# Patient Record
Sex: Female | Born: 2000 | Race: Black or African American | Hispanic: No | Marital: Single | Smoking: Never smoker
Health system: Southern US, Community
[De-identification: ages and names within clinical notes are randomized; demographics above are authoritative.]

## PROBLEM LIST (undated history)

## (undated) DIAGNOSIS — F29 Unspecified psychosis not due to a substance or known physiological condition: Secondary | ICD-10-CM

## (undated) DIAGNOSIS — J45909 Unspecified asthma, uncomplicated: Secondary | ICD-10-CM

## (undated) DIAGNOSIS — Z789 Other specified health status: Secondary | ICD-10-CM

---
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
## 2018-03-29 ENCOUNTER — Other Ambulatory Visit: Payer: Self-pay

## 2018-03-29 ENCOUNTER — Encounter (HOSPITAL_COMMUNITY): Payer: Self-pay | Admitting: *Deleted

## 2018-03-29 ENCOUNTER — Inpatient Hospital Stay (HOSPITAL_COMMUNITY)
Admission: RE | Admit: 2018-03-29 | Discharge: 2018-04-05 | DRG: 885 | Disposition: A | Discharge status: Home / Self Care | Payer: Medicaid Other | Attending: Psychiatry | Admitting: Psychiatry

## 2018-03-29 DIAGNOSIS — F4310 Post-traumatic stress disorder, unspecified: Secondary | ICD-10-CM | POA: Diagnosis present

## 2018-03-29 DIAGNOSIS — J45909 Unspecified asthma, uncomplicated: Secondary | ICD-10-CM | POA: Diagnosis present

## 2018-03-29 DIAGNOSIS — F209 Schizophrenia, unspecified: Secondary | ICD-10-CM | POA: Diagnosis present

## 2018-03-29 DIAGNOSIS — F70 Mild intellectual disabilities: Secondary | ICD-10-CM | POA: Diagnosis present

## 2018-03-29 DIAGNOSIS — R450 Nervousness: Secondary | ICD-10-CM | POA: Diagnosis not present

## 2018-03-29 DIAGNOSIS — F4010 Social phobia, unspecified: Secondary | ICD-10-CM | POA: Diagnosis present

## 2018-03-29 DIAGNOSIS — G4700 Insomnia, unspecified: Secondary | ICD-10-CM | POA: Diagnosis present

## 2018-03-29 DIAGNOSIS — Z79899 Other long term (current) drug therapy: Secondary | ICD-10-CM

## 2018-03-29 DIAGNOSIS — F250 Schizoaffective disorder, bipolar type: Principal | ICD-10-CM | POA: Diagnosis present

## 2018-03-29 DIAGNOSIS — R4689 Other symptoms and signs involving appearance and behavior: Secondary | ICD-10-CM

## 2018-03-29 DIAGNOSIS — F419 Anxiety disorder, unspecified: Secondary | ICD-10-CM | POA: Diagnosis present

## 2018-03-29 MED ORDER — DIPHENHYDRAMINE HCL 50 MG/ML IJ SOLN
50.0000 mg | Freq: Once | INTRAMUSCULAR | Status: AC
Start: 2018-03-29 — End: 2018-03-29
  Filled 2018-03-29: qty 1

## 2018-03-29 MED ORDER — OLANZAPINE 10 MG PO TBDP
10.0000 mg | ORAL_TABLET | Freq: Every day | ORAL | Status: DC
  Administered 2018-03-29 – 2018-03-30 (×2): 10 mg via ORAL
  Filled 2018-03-29 (×5): qty 1

## 2018-03-29 MED ORDER — DIPHENHYDRAMINE HCL 50 MG PO CAPS
50.0000 mg | ORAL_CAPSULE | Freq: Once | ORAL | Status: AC
  Administered 2018-03-29: 50 mg via ORAL
  Filled 2018-03-29: qty 1
  Filled 2018-03-29: qty 2

## 2018-03-29 MED ORDER — LORAZEPAM 1 MG PO TABS
1.0000 mg | ORAL_TABLET | Freq: Every evening | ORAL | Status: DC | PRN
  Administered 2018-03-31 – 2018-04-02 (×3): 1 mg via ORAL
  Filled 2018-03-29 (×5): qty 1

## 2018-03-29 MED ORDER — LORAZEPAM 1 MG PO TABS
1.0000 mg | ORAL_TABLET | Freq: Once | ORAL | Status: AC
  Administered 2018-03-29: 1 mg via ORAL
  Filled 2018-03-29: qty 1

## 2018-03-29 MED ORDER — LORAZEPAM 2 MG/ML IJ SOLN: 1.0000 mg | Freq: Once | INTRAMUSCULAR | Status: DC | PRN

## 2018-03-29 MED ORDER — DIPHENHYDRAMINE HCL 12.5 MG/5ML PO ELIX
50.0000 mg | ORAL_SOLUTION | Freq: Every day | ORAL | Status: DC
  Filled 2018-03-29: qty 20

## 2018-03-29 MED ORDER — LORAZEPAM 2 MG/ML IJ SOLN
1.0000 mg | Freq: Once | INTRAMUSCULAR | Status: AC | PRN
  Administered 2018-03-30: 1 mg via INTRAMUSCULAR
  Filled 2018-03-29: qty 1

## 2018-03-29 MED ORDER — ALBUTEROL SULFATE HFA 108 (90 BASE) MCG/ACT IN AERS: 2.0000 | INHALATION_SPRAY | RESPIRATORY_TRACT | Status: DC | PRN

## 2018-03-29 MED ORDER — DIPHENHYDRAMINE HCL 50 MG/ML IJ SOLN: 50.0000 mg | Freq: Once | INTRAMUSCULAR | Status: DC | PRN

## 2018-03-29 MED ORDER — DIPHENHYDRAMINE HCL 50 MG PO CAPS
50.0000 mg | ORAL_CAPSULE | Freq: Every day | ORAL | Status: DC
  Administered 2018-03-29 – 2018-04-04 (×7): 50 mg via ORAL
  Filled 2018-03-29: qty 1
  Filled 2018-03-29: qty 2
  Filled 2018-03-29: qty 1
  Filled 2018-03-29: qty 2
  Filled 2018-03-29 (×6): qty 1
  Filled 2018-03-29: qty 2
  Filled 2018-03-29: qty 1
  Filled 2018-03-29: qty 2

## 2018-03-29 MED ORDER — LORAZEPAM 2 MG/ML IJ SOLN: 1.0000 mg | Freq: Once | INTRAMUSCULAR | Status: AC

## 2018-03-29 MED ORDER — HALOPERIDOL LACTATE 5 MG/ML IJ SOLN: 1.0000 mg | Freq: Once | INTRAMUSCULAR | Status: DC | PRN

## 2018-03-29 MED ORDER — DIPHENHYDRAMINE HCL 50 MG/ML IJ SOLN
50.0000 mg | Freq: Once | INTRAMUSCULAR | Status: AC | PRN
  Administered 2018-03-30: 50 mg via INTRAMUSCULAR
  Filled 2018-03-29: qty 1

## 2018-03-29 MED ORDER — TRAZODONE HCL 50 MG PO TABS
50.0000 mg | ORAL_TABLET | Freq: Every evening | ORAL | Status: DC | PRN
  Administered 2018-03-29 – 2018-04-04 (×10): 50 mg via ORAL
  Filled 2018-03-29 (×7): qty 1

## 2018-03-29 MED ORDER — HALOPERIDOL LACTATE 5 MG/ML IJ SOLN
1.0000 mg | Freq: Once | INTRAMUSCULAR | Status: AC | PRN
  Administered 2018-03-30: 1 mg via INTRAMUSCULAR
  Filled 2018-03-29: qty 1

## 2018-03-29 MED ORDER — HALOPERIDOL 1 MG PO TABS
1.0000 mg | ORAL_TABLET | Freq: Once | ORAL | Status: AC
  Administered 2018-03-29: 1 mg via ORAL
  Filled 2018-03-29: qty 2
  Filled 2018-03-29: qty 1

## 2018-03-29 NOTE — Tx Team (Signed)
Initial Treatment Plan 03/29/2018 4:10 PM Burnett ShengMarian A Heatherington ZOX:[id number]    PATIENT STRESSORS: Other: hallucinations   PATIENT STRENGTHS: Ability for insight General fund of knowledge Supportive family/friends   PATIENT IDENTIFIED PROBLEMS: Hallucinations "My attitude and coping skills"                     DISCHARGE CRITERIA:  Ability to meet basic life and health needs Improved stabilization in mood, thinking, and/or behavior Verbal commitment to aftercare and medication compliance  PRELIMINARY DISCHARGE PLAN: Attend aftercare/continuing care group Return to previous living arrangement  PATIENT/FAMILY INVOLVEMENT: This treatment plan has been presented to and reviewed with the patient, Burnett ShengMarian A Derringer, and/or family member, .  The patient and family have been given the opportunity to ask questions and make suggestions.  Kyston Gonce, [place], [place] 03/29/2018, 4:10 PM

## 2018-03-29 NOTE — BH Assessment (Signed)
Assessment Note  Tracy Bond is an 17 y.o. female who presents voluntarily accompanied by her mom and step dad reporting primary symptoms of anxiety and hearing voices and seeing things. Pt has a history of psychosis, and her mom says that she is triggered at times by "big events", and she recently had a birthday.  No other stressors are known and pt is taking her medication prescribed by psychiatrist at a neurology center and was recently evaluated by a psychologist Dr. Langston Masker at [facility] (see chart for psychological eval, IQ of 64).  Pt acknowledges symptoms including crying spells, social withdrawal, loss of interest in usual pleasures, decreased concentration, fatigue, irritability, decreased sleep.   Pt denies SI, HI, SA. PT describes 0 past attempts or history of violence. Pt states that onset of symptoms began a few days ago.  Pt identifies primary residence as with parents. Pt identifies primary supports as parents, family. Pt's social history includes, "Being a homebody". Pt states she has had no previous work history. Pt identifies no previous legal involvement. Pt denies abuse history.  Pt describes family MH as: paternal GM has mental health history.  Pt has poor insight and judgment. Pt's memory is impaired by mental status.? ? MSE: Pt is casually dressed, alert, oriented x4 with blocked, pressured speech and restless motor behavior with mannerisms. Eye contact is poor. Pt's mood is fearful and affect is anxious. Affect is congruent with mood. Thought process is coherent and irrelevant. There is indication that pt is currently responding to internal stimuli and experiencing delusional thought content. Pt was cooperative throughout assessment.   Shuvon, NP, recommended intpatient psychiatric treatment. Per Inetta Fermo, [facility] has a bed available.   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 Asthma   . Medical history non-contributory   . Psychosis 03/19/2017    No past surgical history on file.  Family History:  Family History  Problem Relation Age of Onset  . Mental illness Neg Hx     Social History:  reports that she has never smoked. She has never used smokeless tobacco. She reports that she does not drink alcohol or use drugs.  Additional Social History:  Alcohol / Drug Use Pain Medications: none Prescriptions: none Over the Counter: none Longest period of sobriety (when/how long): NA  CIWA:   COWS:    Allergies: No Known Allergies  Home Medications:  (Not in a hospital admission)  OB/GYN Status:  No LMP recorded.  General Assessment Data Location of Assessment: [facility] Assessment Services TTS Assessment: In system Is this a Tele or Face-to-Face Assessment?: Face-to-Face Is this an Initial Assessment or a Re-assessment for this encounter?: Initial Assessment Marital status: Single Living Arrangements: Parent Can pt return to current living arrangement?: Yes Admission Status: Voluntary Is patient capable of signing voluntary admission?: Yes Referral Source: Self/Family/Friend Insurance type: MCD  Medical Screening Exam [facility] Walk-in ONLY) Medical Exam completed: Yes  Crisis Care Plan Living Arrangements: Parent Name of Psychiatrist: Dr. Isidor Holts Name of Therapist: none  Education Status Is patient currently in school?: No Is the patient employed, unemployed or receiving disability?: Unemployed  Risk to self with the past 6 months Suicidal Ideation: No Has patient been a risk to self within the past 6 months prior to admission? : No Suicidal Intent: No Has patient had any suicidal intent within the past 6 months prior to admission? : No Is patient at risk  for suicide?: No Suicidal Plan?: No Has patient had any suicidal plan within the past 6 months prior to admission? : No Access to Means: No What has been your use of drugs/alcohol  within the last 12 months?: (denies) Previous Attempts/Gestures: No Intentional Self Injurious Behavior: None Family Suicide History: No Recent stressful life event(s): (birthday) Persecutory voices/beliefs?: Yes Depression: Yes Depression Symptoms: Tearfulness, Insomnia Substance abuse history and/or treatment for substance abuse?: No Suicide prevention information given to non-admitted patients: Yes  Risk to Others within the past 6 months Homicidal Ideation: No Does patient have any lifetime risk of violence toward others beyond the six months prior to admission? : No Thoughts of Harm to Others: No Current Homicidal Intent: No Current Homicidal Plan: No Access to Homicidal Means: No History of harm to others?: No Assessment of Violence: None Noted Does patient have access to weapons?: No Criminal Charges Pending?: No Does patient have a court date: No Is patient on probation?: No  Psychosis Hallucinations: Auditory, Visual Delusions: None noted  Mental Status Report Appearance/Hygiene: Unremarkable Eye Contact: Fair Motor Activity: Agitation, Mannerisms, Restlessness Speech: Tangential, Rapid, Pressured Level of Consciousness: Alert Mood: Anxious, Helpless Affect: Anxious, Blunted, Fearful, Frightened, Labile Anxiety Level: Severe Thought Processes: Thought Blocking, Irrelevant Judgement: Impaired Orientation: Person, Situation Obsessive Compulsive Thoughts/Behaviors: Severe  Cognitive Functioning Concentration: Poor Memory: Recent Impaired, Remote Intact Is patient IDD: Yes Level of Function: 4-5th grade Is patient DD?: No I IQ score available?: No Insight: Poor Impulse Control: Poor Appetite: Good Have you had any weight changes? : No Change Sleep: Decreased Total Hours of Sleep: (hasn't slept in 2 days) Vegetative Symptoms: None  ADLScreening [facility] Assessment Services) Patient's cognitive ability adequate to safely complete daily activities?:  Yes Patient able to express need for assistance with ADLs?: Yes Independently performs ADLs?: Yes (appropriate for developmental age)  Prior Inpatient Therapy Prior Inpatient Therapy: Yes Prior Therapy Dates: 2018 [facility] Reason for Treatment: psychosis  Prior Outpatient Therapy Prior Outpatient Therapy: Yes Prior Therapy Dates: ongoing Prior Therapy Facilty/Provider(s): Dr. Isidor HoltsMorriss  Reason for Treatment: psychosis Does patient have an ACCT team?: No Does patient have Intensive In-House Services?  : No Does patient have [facility] services? : No Does patient have P4CC services?: No  ADL Screening (condition at time of admission) Patient's cognitive ability adequate to safely complete daily activities?: Yes Is the patient deaf or have difficulty hearing?: No Does the patient have difficulty seeing, even when wearing glasses/contacts?: No Does the patient have difficulty concentrating, remembering, or making decisions?: Yes Patient able to express need for assistance with ADLs?: Yes Does the patient have difficulty dressing or bathing?: No Independently performs ADLs?: Yes (appropriate for developmental age) Does the patient have difficulty walking or climbing stairs?: No Weakness of Legs: None Weakness of Arms/Hands: None  Home Assistive Devices/Equipment Home Assistive Devices/Equipment: None  Therapy Consults (therapy consults require a physician order) PT Evaluation Needed: No OT Evalulation Needed: No SLP Evaluation Needed: No Abuse/Neglect Assessment (Assessment to be complete while patient is alone) Abuse/Neglect Assessment Can Be Completed: Yes Physical Abuse: Denies Verbal Abuse: Denies Sexual Abuse: Denies Exploitation of patient/patient's resources: Denies Self-Neglect: Denies Values / Beliefs Cultural Requests During Hospitalization: None Spiritual Requests During Hospitalization: None Consults Spiritual Care Consult Needed:  No Social Work Consult Needed: No Merchant navy officerAdvance Directives (For Healthcare) Does Patient Have a Medical Advance Directive?: No Would patient like information on creating a medical advance directive?: No - Patient declined    Additional Information 1:1 In Past  12 Months?: No CIRT Risk: No Elopement Risk: Yes Does patient have medical clearance?: No     Disposition:  Disposition Initial Assessment Completed for this Encounter: Yes Disposition of Patient: Admit Type of inpatient treatment program: Adult  On Site Evaluation by:   Reviewed with Physician:    Theo Dills 03/29/2018 2:32 PM

## 2018-03-29 NOTE — Progress Notes (Signed)
1:1 Note    D: Patient is observed resting in bed with eyes open. Pt appears animated/anxious/impulsive in affect and mood. Pt denies SI/HI/Pain at this time. Pt endorses AVH. Pt states she sees "fire and pens" coming at her and hears things in which she did not elaborate. Pt appears child-like and noted to be making loud abnormal sounds (sounds like a dog's bark) in her room. When staff approached Pt, she is noted to be hypervigilant. Pt is labile in behavior. Encouragement provided. Will continue with POC.   A: 1:1 continued for patient safety. Sitter within arms reach.  R: Safety maintained on unit.

## 2018-03-29 NOTE — H&P (Signed)
Behavioral Health Medical Screening Exam  Tracy Bond is an 17 y.o. female [facility]; brought I by her mother with complaints of paranoia and psychosis.  Patient tearful anxious; not a good historian.  Parent don't feel patient is safe.   Total Time spent with patient: 30 minutes  Psychiatric Specialty Exam: Physical Exam  Constitutional: She is oriented to person, place, and time. She appears well-developed and well-nourished.  Neck: Normal range of motion. Neck supple.  Respiratory: Effort normal.  Musculoskeletal: Normal range of motion.  Neurological: She is alert and oriented to person, place, and time.  Skin: Skin is warm and dry.    Review of Systems  Psychiatric/Behavioral: Positive for depression and hallucinations. The patient is nervous/anxious.   All other systems reviewed and are negative.   Blood pressure (!) 157/87, pulse (!) 106, temperature 99 F (37.2 C), resp. rate 18, SpO2 100 %.There is no height or weight on file to calculate BMI.  General Appearance: Casual  Eye Contact:  Minimal  Speech:  Clear and Coherent  Volume:  Decreased  Mood:  Anxious  Affect:  Depressed and Tearful  Thought Process:  Disorganized  Orientation:  Full (Time, Place, and Person)  Thought Content:  Hallucinations: Auditory  Suicidal Thoughts:  No  Homicidal Thoughts:  No  Memory:  Immediate;   Poor Recent;   Poor Remote;   Poor  Judgement:  Impaired  Insight:  Lacking  Psychomotor Activity:  Restlessness  Concentration: Concentration: Poor and Attention Span: Poor  Recall:  Poor  Fund of Knowledge:Fair  Language: Good  Akathisia:  No  Handed:  Right  AIMS (if indicated):     Assets:  Desire for Improvement Housing Physical Health Social Support  Sleep:       Musculoskeletal: Strength & Muscle Tone: within normal limits Gait & Station: normal Patient leans: N/A  Blood pressure (!) 157/87, pulse (!) 106, temperature 99 F (37.2 C), resp.  rate 18, SpO2 100 %.  Recommendations:  Inpatient psychiatric treatment   Based on my evaluation the patient does not appear to have an emergency medical condition.  Shuvon Rankin, NP 03/29/2018, 2:38 PM

## 2018-03-29 NOTE — Progress Notes (Signed)
Mother called. Mother was on consent sheet. Code was given. Update provided.

## 2018-03-29 NOTE — Progress Notes (Signed)
Psychoeducational Group Note  Date:  03/29/2018 Time:  2340  Group Topic/Focus:  Wrap-Up Group:   The focus of this group is to help patients review their daily goal of treatment and discuss progress on daily workbooks.  Participation Level: Did Not Attend  Participation Quality:  Not Applicable  Affect:  Not Applicable  Cognitive:  Not Applicable  Insight:  Not Applicable  Engagement in Group: Not Applicable  Additional Comments:  The patient did not attend group this evening since she remained in her bedroom.   Camilia Caywood S 03/29/2018, 11:40 PM

## 2018-03-29 NOTE — Progress Notes (Addendum)
1:1 Note   D: Pt observed sleeping in bed with eyes closed. RR even and unlabored. No distress noted. A: 1:1 observation continues for safety. Sitter within arms reach.  R: Pt remains safe.  

## 2018-03-29 NOTE — Progress Notes (Signed)
1:1 Note: Patient placed on constant supervision for safety.  Patient agitated and reported seeing things like bugs coming at her.  Started barking like a dog and making weird noises.  Observed pacing the hallway, very fearful and tearful.  Running towards staff and shouting" in the name of Jesus."  Saying "Jesus protect me." Routine safety checks maintained every 15 minutes.  Support and encouragement offered as needed.  Patient safe on the unit with supervision.

## 2018-03-29 NOTE — Progress Notes (Signed)
Tracy Bond is a 17 year old female pt admitted on voluntary basis after presenting as a walk-in. On admission, Tracy Bond presents as anxious and fidgety and spoke about how she has been experiencing A/V hallucinations. She denies SI and is able to contract for safety on the unit. Tracy Bond reports that she has been taking her medications as prescribed and reports she feels they are working for her. She denies any substance abuse issues. She reports that she lives at home with mom and her fiancee and will go back there upon discharge. Tracy Bond was oriented to the unit and safety maintained.

## 2018-03-30 DIAGNOSIS — R4689 Other symptoms and signs involving appearance and behavior: Secondary | ICD-10-CM

## 2018-03-30 MED ORDER — LORAZEPAM 2 MG/ML IJ SOLN: 1.0000 mg | Freq: Four times a day (QID) | INTRAMUSCULAR | Status: DC | PRN

## 2018-03-30 MED ORDER — OLANZAPINE 5 MG PO TBDP
5.0000 mg | ORAL_TABLET | Freq: Every day | ORAL | Status: DC | PRN
  Administered 2018-03-30 – 2018-04-04 (×4): 5 mg via ORAL
  Filled 2018-03-30 (×3): qty 1

## 2018-03-30 MED ORDER — LORAZEPAM 1 MG PO TABS
1.0000 mg | ORAL_TABLET | Freq: Four times a day (QID) | ORAL | Status: DC | PRN
  Administered 2018-03-30 – 2018-04-04 (×6): 1 mg via ORAL
  Filled 2018-03-30 (×5): qty 1

## 2018-03-30 NOTE — Progress Notes (Signed)
1:1 Note   D: Pt observed sleeping in bed with eyes closed. RR even and unlabored. No distress noted. A: 1:1 observation continues for safety. Sitter within arms reach.  R: Pt remains safe.  
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
## 2018-03-30 NOTE — Progress Notes (Signed)
Patient being monitored 1:1 Patient required constant redirection by 1:1 staff.  Patient was often noted crying, yelling and in severe dyscontrol. Patient was free of harm.  1:1 staff in close proximity to patient.

## 2018-03-30 NOTE — [facility] Counselor (Signed)
CSW attempted to meet with pt to complete PSA.  Pt was asleep and tech reports pt just given injection.  Will attempt later. Garner NashGregory Issaih Kaus, MSW, LCSW Clinical Social Worker 03/30/2018 3:24 PM

## 2018-03-30 NOTE — Progress Notes (Signed)
Patient being monitored 1:1 Patient required constant redirection by 1:1 staff.  Patient was often noted crying, yelling and in severe dyscontrol. Patient was free of harm.  1:1 staff in close proximity to patient.   

## 2018-03-30 NOTE — Progress Notes (Signed)
1:1 Note   D: Patient observed resting in bed with eyes closed. Pt's mother came to visit this evening; positive interaction noted. Pt appears labile in affect and mood. Pt denies SI/HI/Pain at this time. Pt continues to endorses AVH. Pt appears child-like with attention-seeking behaviors. Poor insight. Pt is paranoid with medications. Pt states Zyprexa makes her gain weight and she does not want to be on it long-term. Pt was encourage to speak with provider. Encouragement and support provided. PRN ativan and trazodone with repeat requested and given. Will continue with POC.   A: 1:1 continued for patient safety. Sitter within arms reach.  R: Safety maintained on unit.

## 2018-03-30 NOTE — Progress Notes (Signed)
Recreation Therapy Notes  INPATIENT RECREATION THERAPY ASSESSMENT  Patient Details Name: Tracy Bond MRN: [id number] DOB: 2001/03/16 Today's Date: 03/30/2018       Information Obtained From: Chart Review  Able to Participate in Assessment/Interview: No  Patient Presentation: Anxious  Reason for Admission (Per Patient): Other (Comments)(Per chart auditory/visual hallucinations, anxious)  Patient Stressors: (Pt unable to answer)  Coping Skills:   (Pt unable to answer)  Leisure Interests (2+):  (Pt unable to answer)  Frequency of Recreation/Participation: (Pt unable to answer)  Awareness of Community Resources:  (Pt unable to answer)  Community Resources:     Current Use:    If no, Barriers?:    Expressed Interest in [address] [employer] CorporationCommunity Resource Information: (Pt unable to answer)  [place]  Patient Main Form of Transportation:    Patient Strengths:  (Unable to answer)  Patient Identified Areas of Improvement:  (Unable to answer)  Patient Goal for Hospitalization:  Per chart, attitude and coping skills  Current SI (including self-harm):     Current HI:     Current AVH:    Staff Intervention Plan: Group Attendance, Collaborate with Interdisciplinary Treatment Team  Consent to Intern Participation: N/A   Caroll RancherMarjette Alesi Zachery, LRT/CTRS  Caroll RancherLindsay, Ash Mcelwain A 03/30/2018, 12:23 PM

## 2018-03-30 NOTE — Progress Notes (Signed)
Recreation Therapy Notes  Date: 7.2.19 Time: 0945 Location: [address] Dayroom  Group Topic: Wellness  Goal Area(s) Addresses:  Patient will define components of whole wellness. Patient will verbalize benefit of whole wellness.  Intervention: Music, Exercise  Activity:  Exercise.  LRT lead group in Bond series of stretches to get them loose.  Once patients were loose, each patient got the opportunity to lead the group in an exercise.  The group went through 4 rounds of exercises with Bond water break in between.    Education: Wellness, Building control surveyorDischarge Planning.   Education Outcome: Acknowledges education/In group clarification offered/Needs additional education.   Clinical Observations/Feedback: Pt did not attend group.    Tracy RancherMarjette Hamza Bond, LRT/CTRS         Tracy AbedLindsay, Tracy Bond 03/30/2018 11:29 AM
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
## 2018-03-30 NOTE — [facility] Group Notes (Signed)
[facility]  Type of Therapy: [facility] Presentation  Participation Level: Did not attend  Participation Quality:  Affect:   Cognitive:   Insight:   Engagement in Therapy:  Modes of Intervention: Discussion, Education and Socialization  Summary of Progress/Problems: [facility] [employer] ([facility]) Speaker came to talk about his personal journey with mental health. The pt processed ways by which to relate to the speaker. MHA speaker provided handouts and educational information pertaining to groups and services [facility]. Pt was engaged in speaker's presentation and was receptive to resources provided.    Lorri FrederickWierda, Ramzi Brathwaite Jon, LCSW 03/30/2018 1:23 PM

## 2018-03-31 DIAGNOSIS — R4689 Other symptoms and signs involving appearance and behavior: Secondary | ICD-10-CM

## 2018-03-31 LAB — CBC
HCT: 38.8 % (ref 36.0–46.0)
Hemoglobin: 12.7 g/dL (ref 12.0–15.0)
MCH: 29.7 pg (ref 26.0–34.0)
MCHC: 32.7 g/dL (ref 30.0–36.0)
MCV: 90.9 fL (ref 78.0–100.0)
PLATELETS: 251 10*3/uL (ref 150–400)
RBC: 4.27 MIL/uL (ref 3.87–5.11)
RDW: 13.7 % (ref 11.5–15.5)
WBC: 5.7 10*3/uL (ref 4.0–10.5)

## 2018-03-31 LAB — COMPREHENSIVE METABOLIC PANEL
ALK PHOS: 58 U/L (ref 38–126)
ALT: 12 U/L (ref 0–44)
ANION GAP: 8 (ref 5–15)
AST: 31 U/L (ref 15–41)
Albumin: 3.7 g/dL (ref 3.5–5.0)
BUN: 8 mg/dL (ref 6–20)
CALCIUM: 8.8 mg/dL — AB (ref 8.9–10.3)
CO2: 27 mmol/L (ref 22–32)
CREATININE: 0.75 mg/dL (ref 0.44–1.00)
Chloride: 107 mmol/L (ref 98–111)
Glucose, Bld: 88 mg/dL (ref 70–99)
Potassium: 3.5 mmol/L (ref 3.5–5.1)
SODIUM: 142 mmol/L (ref 135–145)
Total Bilirubin: 0.6 mg/dL (ref 0.3–1.2)
Total Protein: 7 g/dL (ref 6.5–8.1)

## 2018-03-31 LAB — LIPID PANEL
CHOL/HDL RATIO: 2.8 ratio
CHOLESTEROL: 169 mg/dL (ref 0–200)
HDL: 61 mg/dL (ref >40)
LDL Cholesterol: 100 mg/dL — ABNORMAL HIGH (ref 0–99)
Triglycerides: 40 mg/dL (ref <150)
VLDL: 8 mg/dL (ref 0–40)

## 2018-03-31 LAB — HEMOGLOBIN A1C
HEMOGLOBIN A1C: 5.3 % (ref 4.8–5.6)
MEAN PLASMA GLUCOSE: 105.41 mg/dL

## 2018-03-31 MED ORDER — ARIPIPRAZOLE 10 MG PO TABS
10.0000 mg | ORAL_TABLET | Freq: Every day | ORAL | Status: AC
  Administered 2018-04-01: 10 mg via ORAL
  Filled 2018-03-31: qty 1

## 2018-03-31 MED ORDER — CITALOPRAM HYDROBROMIDE 20 MG PO TABS
20.0000 mg | ORAL_TABLET | Freq: Every day | ORAL | Status: DC
Start: 2018-03-31 — End: 2018-04-05
  Administered 2018-03-31 – 2018-04-05 (×6): 20 mg via ORAL
  Filled 2018-03-31 (×9): qty 1

## 2018-03-31 MED ORDER — ARIPIPRAZOLE 10 MG PO TABS
10.0000 mg | ORAL_TABLET | Freq: Every day | ORAL | Status: DC
  Filled 2018-03-31 (×2): qty 1

## 2018-03-31 MED ORDER — ARIPIPRAZOLE 5 MG PO TABS
5.0000 mg | ORAL_TABLET | Freq: Every day | ORAL | Status: DC
  Filled 2018-03-31 (×2): qty 1

## 2018-03-31 MED ORDER — CITALOPRAM HYDROBROMIDE 20 MG PO TABS
20.0000 mg | ORAL_TABLET | Freq: Every day | ORAL | Status: DC
  Filled 2018-03-31 (×3): qty 1

## 2018-03-31 MED ORDER — ARIPIPRAZOLE 5 MG PO TABS
5.0000 mg | ORAL_TABLET | Freq: Every day | ORAL | Status: AC
  Administered 2018-03-31: 5 mg via ORAL
  Filled 2018-03-31: qty 1
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
## 2018-03-31 NOTE — Tx Team (Signed)
Interdisciplinary Treatment and Diagnostic Plan Update  03/31/2018 Time of Session: [place] MRN: [id number]  Principal Diagnosis: Schizoaffective disorder, bipolar type [facility])  Secondary Diagnoses: Principal Problem:   Schizoaffective disorder, bipolar type ([place]) Active Problems:   Schizophrenia ([place])   Aggressive behavior   Current Medications:  Current Facility-Administered Medications  Medication Dose Route Frequency Provider Last Rate Last Dose  . albuterol (PROVENTIL HFA;VENTOLIN HFA) 108 (90 Base) MCG/ACT inhaler 2 puff  2 puff Inhalation Q4H PRN Rankin, Shuvon B, NP      . [START ON 04/01/2018] ARIPiprazole (ABILIFY) tablet 5 mg  5 mg Oral Daily Lavella Hammock, MD       Or  . Derrill Memo ON 04/01/2018] ARIPiprazole (ABILIFY) tablet 10 mg  10 mg Oral Daily Lavella Hammock, MD      . citalopram (CELEXA) tablet 20 mg  20 mg Oral Daily Lavella Hammock, MD      . diphenhydrAMINE (BENADRYL) capsule 50 mg  50 mg Oral QHS Pennelope Bracken, MD   50 mg at 03/30/18 2028  . LORazepam (ATIVAN) tablet 1 mg  1 mg Oral Q6H PRN Lavella Hammock, MD   1 mg at 03/31/18 2902   Or  . LORazepam (ATIVAN) injection 1 mg  1 mg Intramuscular Q6H PRN Lavella Hammock, MD      . LORazepam (ATIVAN) tablet 1 mg  1 mg Oral QHS PRN Rankin, Shuvon B, NP      . OLANZapine zydis (ZYPREXA) disintegrating tablet 5 mg  5 mg Oral Daily PRN Lavella Hammock, MD   5 mg at 03/31/18 0839  . traZODone (DESYREL) tablet 50 mg  50 mg Oral QHS PRN,MR X 1 Rankin, Shuvon B, NP   50 mg at 03/30/18 2102   PTA Medications: Medications Prior to Admission  Medication Sig Dispense Refill Last Dose  . albuterol (PROVENTIL HFA;VENTOLIN HFA) 108 (90 Base) MCG/ACT inhaler Inhale 2 puffs into the lungs every 4 (four) hours as needed for wheezing or shortness of breath. 1 Inhaler 3 unknown  . benztropine (COGENTIN) 0.5 MG tablet Take 0.5 mg by mouth 2 (two) times daily.  1 unknown  . citalopram (CELEXA) 20 MG tablet  Take 20 mg by mouth at bedtime.  1 unknown  . diphenhydrAMINE (BENADRYL) 12.5 MG/5ML elixir Take 20 mLs (50 mg total) by mouth at bedtime. 120 mL 0 unknown  . LORazepam (ATIVAN) 1 MG tablet Take 1 tablet (1 mg total) by mouth at bedtime as needed for sleep. 30 tablet 0 unknown  . OLANZapine zydis (ZYPREXA) 10 MG disintegrating tablet Take 1 tablet (10 mg total) by mouth at bedtime. 30 tablet 0 unknown  . ziprasidone (GEODON) 40 MG capsule Take 40 mg by mouth at bedtime.  1 unknown  . ranitidine (ZANTAC) 150 MG tablet Take 1 tablet (150 mg total) by mouth 2 (two) times daily. (Patient not taking: Reported on 03/30/2018) 60 tablet 0 Not Taking at Unknown time  . traZODone (DESYREL) 50 MG tablet Take 1 tablet (50 mg total) by mouth at bedtime as needed and may repeat dose one time if needed for sleep. (Patient not taking: Reported on 03/30/2018) 60 tablet 0 Not Taking at Unknown time    Patient Stressors: Other: hallucinations  Patient Strengths: Ability for insight General fund of knowledge Supportive family/friends  Treatment Modalities: Medication Management, Group therapy, Case management,  1 to 1 session with clinician, Psychoeducation, Recreational therapy.   Physician Treatment Plan for Primary Diagnosis: Schizoaffective disorder,  bipolar type ([place]) Long Term Goal(s): Improvement in symptoms so as ready for discharge Improvement in symptoms so as ready for discharge   Short Term Goals: Ability to identify changes in lifestyle to reduce recurrence of condition will improve Ability to demonstrate self-control will improve Ability to identify and develop effective coping behaviors will improve Ability to identify triggers associated with substance abuse/mental health issues will improve Ability to identify changes in lifestyle to reduce recurrence of condition will improve Ability to demonstrate self-control will improve Ability to identify and develop effective coping behaviors will  improve Ability to identify triggers associated with substance abuse/mental health issues will improve  Medication Management: Evaluate patient's response, side effects, and tolerance of medication regimen.  Therapeutic Interventions: 1 to 1 sessions, Unit Group sessions and Medication administration.  Evaluation of Outcomes: Not Met  Physician Treatment Plan for Secondary Diagnosis: Principal Problem:   Schizoaffective disorder, bipolar type ([place]) Active Problems:   Schizophrenia ([place])   Aggressive behavior  Long Term Goal(s): Improvement in symptoms so as ready for discharge Improvement in symptoms so as ready for discharge   Short Term Goals: Ability to identify changes in lifestyle to reduce recurrence of condition will improve Ability to demonstrate self-control will improve Ability to identify and develop effective coping behaviors will improve Ability to identify triggers associated with substance abuse/mental health issues will improve Ability to identify changes in lifestyle to reduce recurrence of condition will improve Ability to demonstrate self-control will improve Ability to identify and develop effective coping behaviors will improve Ability to identify triggers associated with substance abuse/mental health issues will improve     Medication Management: Evaluate patient's response, side effects, and tolerance of medication regimen.  Therapeutic Interventions: 1 to 1 sessions, Unit Group sessions and Medication administration.  Evaluation of Outcomes: Not Met   RN Treatment Plan for Primary Diagnosis: Schizoaffective disorder, bipolar type ([place]) Long Term Goal(s): Knowledge of disease and therapeutic regimen to maintain health will improve  Short Term Goals: Ability to identify and develop effective coping behaviors will improve and Compliance with prescribed medications will improve  Medication Management: RN will administer medications as ordered by provider,  will assess and evaluate patient's response and provide education to patient for prescribed medication. RN will report any adverse and/or side effects to prescribing provider.  Therapeutic Interventions: 1 on 1 counseling sessions, Psychoeducation, Medication administration, Evaluate responses to treatment, Monitor vital signs and CBGs as ordered, Perform/monitor CIWA, COWS, AIMS and Fall Risk screenings as ordered, Perform wound care treatments as ordered.  Evaluation of Outcomes: Not Met   LCSW Treatment Plan for Primary Diagnosis: Schizoaffective disorder, bipolar type ([place]) Long Term Goal(s): Safe transition to appropriate next level of care at discharge, Engage patient in therapeutic group addressing interpersonal concerns.  Short Term Goals: Engage patient in aftercare planning with referrals and resources, Increase social support and Increase skills for wellness and recovery  Therapeutic Interventions: Assess for all discharge needs, 1 to 1 time with Social worker, Explore available resources and support systems, Assess for adequacy in community support network, Educate family and significant other(s) on suicide prevention, Complete Psychosocial Assessment, Interpersonal group therapy.  Evaluation of Outcomes: Not Met   Progress in Treatment: Attending groups: No. Participating in groups: No. Taking medication as prescribed: Yes. Toleration medication: Yes. Family/Significant other contact made: No, will contact:  mother Patient understands diagnosis: No. Discussing patient identified problems/goals with staff: Yes. Medical problems stabilized or resolved: Yes. Denies suicidal/homicidal ideation: Yes. Issues/concerns per patient self-inventory: No. Other:  none  New problem(s) identified: No, Describe:  none  New Short Term/Long Term Goal(s):  Patient Goals:  "to get better, get my rest"  Discharge Plan or Barriers:   Reason for Continuation of Hospitalization:  Hallucinations Medication stabilization  Estimated Length of Stay: 3-5 days  Attendees: Patient: Tracy Bond 03/31/2018   Physician: Dr Leverne Humbles, MD 03/31/2018   Nursing: Elesa Massed, RN 03/31/2018   RN Care Manager: 03/31/2018   Social Worker: Lurline Idol, LCSW 03/31/2018   Recreational Therapist:  03/31/2018   Other:  03/31/2018   Other:  03/31/2018   Other: 03/31/2018     Scribe for Treatment Team: Joanne Chars, [place] 03/31/2018 2:48 PM

## 2018-03-31 NOTE — Progress Notes (Signed)
1:1 Note   D: Pt observed sleeping in bed with eyes closed. RR even and unlabored. No distress noted. A: 1:1 observation continues for safety. Sitter within arms reach.  R: Pt remains safe.  

## 2018-03-31 NOTE — Progress Notes (Addendum)
1:1 Note   D: Patient observed resting in bed with eyes closed.Pt's family came to visit this evening; positive interaction noted. Pt appears/irritable affect and mood. Pt denies SI/HI/Pain at this time. Pt continues to endorses AVH. Pt appears child-like in behavior. Poor insight. Pt is paranoid with medications.Pt gets easily agitated and needs continuous encouragement and support.Will continue with POC. A: 1:1 continued for patient safety.Sitter within arms reach. R:Safety maintained on unit.
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
## 2018-03-31 NOTE — [facility] Group Notes (Signed)
[facility] LCSW Group Therapy Note  Date/Time: 03/31/18, 1315  Type of Therapy and Topic:  Group Therapy:  Overcoming Obstacles  Participation Level:  Did not attend  Description of Group:    In this group patients will be encouraged to explore what they see as obstacles to their own wellness and recovery. They will be guided to discuss their thoughts, feelings, and behaviors related to these obstacles. The group will process together ways to cope with barriers, with attention given to specific choices patients can make. Each patient will be challenged to identify changes they are motivated to make in order to overcome their obstacles. This group will be process-oriented, with patients participating in exploration of their own experiences as well as giving and receiving support and challenge from other group members.  Therapeutic Goals: 1. Patient will identify personal and current obstacles as they relate to admission. 2. Patient will identify barriers that currently interfere with their wellness or overcoming obstacles.  3. Patient will identify feelings, thought process and behaviors related to these barriers. 4. Patient will identify two changes they are willing to make to overcome these obstacles:    Summary of Patient Progress      Therapeutic Modalities:   Cognitive Behavioral Therapy Solution Focused Therapy Motivational Interviewing Relapse Prevention Therapy  Greg Nakota Elsen, LCSW 

## 2018-03-31 NOTE — Progress Notes (Signed)
1:1Note  D: Pt observed in dayroom, getting vitals taken. Pt is quiet and cooperative. No additional c/o's.   A: 1:1 observation continues for safety. Sitter within arms reach. R:Pt remains safe.

## 2018-03-31 NOTE — Progress Notes (Signed)
Adult Psychoeducational Group Note  Date:  03/31/2018 Time:  8:32 PM  Group Topic/Focus:  Wrap-Up Group:   The focus of this group is to help patients review their daily goal of treatment and discuss progress on daily workbooks.  Participation Level:  Active  Participation Quality:  Appropriate  Affect:  Appropriate  Cognitive:  Appropriate  Insight: Appropriate  Engagement in Group:  Engaged  Modes of Intervention:  Discussion  Additional Comments:  The patient expressed that she rates today a 9.The patient also said that attended groups.  Octavio Mannshigpen, Arisbeth Purrington Lee 03/31/2018, 8:32 PM

## 2018-03-31 NOTE — Progress Notes (Signed)
Recreation Therapy Notes  Date: 7.3.19 Time: 1000 Location: 500 Hall Dayroom  Group Topic: Communication  Goal Area(s) Addresses:  Patient will effectively communicate with peers in group.  Patient will verbalize benefit of healthy communication. Patient will verbalize positive effect of healthy communication on post d/c goals.  Patient will identify communication techniques that made activity effective for group.   Intervention: Drawings  Activity:  Merchandiser, retailGeometrical Drawings.  Patients were paired up and sitting back to back.  Each patient was given the opportunity to listen and be the speaker.  The person speaking had to describe the picture as it appears to their partner.  The only question the listener could ask was for the speaker to repeat themselves.  Education: Communication, Discharge Planning  Education Outcome: Acknowledges understanding/In group clarification offered/Needs additional education.   Clinical Observations/Feedback:  Pt did not attend group.    Tracy RancherMarjette Hanley Bond, LRT/CTRS         Tracy Bond, Tracy Bond 03/31/2018 11:56 AM

## 2018-04-01 DIAGNOSIS — Z79899 Other long term (current) drug therapy: Secondary | ICD-10-CM

## 2018-04-01 DIAGNOSIS — R450 Nervousness: Secondary | ICD-10-CM

## 2018-04-01 DIAGNOSIS — F250 Schizoaffective disorder, bipolar type: Principal | ICD-10-CM

## 2018-04-01 MED ORDER — ARIPIPRAZOLE 10 MG PO TABS
10.0000 mg | ORAL_TABLET | Freq: Every day | ORAL | Status: DC
  Administered 2018-04-02 – 2018-04-05 (×4): 10 mg via ORAL
  Filled 2018-04-01 (×6): qty 1

## 2018-04-01 NOTE — Progress Notes (Signed)
1:1 Note   D: Pt observed sleeping in bed with eyes closed. RR even and unlabored. No distress noted. A: 1:1 observation continues for safety. Sitter within arms reach.  R: Pt remains safe.  

## 2018-04-01 NOTE — Progress Notes (Signed)
Patient ID: Tracy Bond, female   DOB: 07/25/2001, 17 y.o.   MRN: [id number]  1:1 Nursing Progress Note  D:  Patient is observed sleeping in bed. Patient has been seen up in the milieu but does not engage much with staff/peers. Environment is secured. Sitter observed with patient.  A: Patient remains on 1:1 observation per provider orders. Low fall risk precautions in place. Patient safety monitored with q15 minute safety checks.  R: Patient remains safe on the unit at this time. Will continue to monitor with 1:1 sitter, q15 min safety checks & q4 hour nursing assessments.

## 2018-04-01 NOTE — Progress Notes (Signed)
Patient ID: Burnett ShengMarian A Soltero, female   DOB: 01-04-2001, 17 y.o.   MRN: [id number]  Patient is observed visiting with her mom in her room and is in no acute distress. Will continue to monitor.

## 2018-04-01 NOTE — Progress Notes (Signed)
Patient ID: Burnett ShengMarian A Impson, female   DOB: 23-Jul-2001, 17 y.o.   MRN: [id number]  1:1 Nursing Progress Note  D: Patient is observed resting in bed awake. Patient does not stay in the dayroom for long periods of time. Patient does appear to have loud/pressured speech at times and can be irritable. Patient asks writer repeatedly, "do I look better to you? Can I go home?" Patient reports, "I'm gonna behave and respect ya'll and do what I need to do to leave. Mama is throwing me a birthday party when I get home". Environment is secured. Sitter observed with patient. Patient currently denies SI/HI/AVH. Patient states, "I don't feel bugs crawling on me anymore or hear voices".  A: Patient remains on 1:1 observation per provider orders. Low fall risk precautions in place. Patient safety monitored with q15 minute safety checks.  R: Patient remains safe on the unit at this time. Will continue to monitor with 1:1 sitter, q15 min safety checks & q4 hour nursing assessments.

## 2018-04-01 NOTE — Progress Notes (Signed)
Nursing 1:1 note D:Pt observed lying in bed with eyes open. RR even and unlabored. No distress noted. A: 1:1 observation continues for safety  R: pt remains safe  
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
## 2018-04-01 NOTE — Progress Notes (Signed)
Patient ID: Tracy Bond, female   DOB: [birth year], 17 y.o.   MRN: [id number]  1:1 Nursing Progress Note  D: Patient is observed sleeping in bed in no acute distress. Patient has been compliant with her scheduled medications this morning. Environment is secured. Sitter observed with patient.  A: Patient remains on 1:1 observation per provider orders. Low fall risk precautions in place. Patient safety monitored with q15 minute safety checks. Labs, vitals and patient behavior monitored.   R: Patient remains safe on the unit at this time. Will continue to monitor with 1:1 sitter, q15 min safety checks & q4 hour nursing assessments.

## 2018-04-01 NOTE — Plan of Care (Signed)
D: Pt denies SI/HI/AVH. Pt was agitated earlier and did not want to talk to writer, pt came back later and apologized. Pt did answer questions but did not forward much information.   A: Pt was offered support and encouragement. Pt was given scheduled medications. Pt was encourage to attend groups. Q 15 minute checks were done for safety.   R:Pt attends groups and interacts  with peers and staff. Pt is taking medication. Pt has no complaints.Pt receptive to treatment and safety maintained on unit.   Problem: Activity: Goal: Interest or engagement in activities will improve Outcome: Progressing   Problem: Safety: Goal: Periods of time without injury will increase Outcome: Progressing

## 2018-04-02 NOTE — Progress Notes (Signed)
Nursing 1:1 note D:Pt observed sleeping in bed with eyes closed. RR even and unlabored. No distress noted. A: 1:1 observation continues for safety  R: pt remains safe  

## 2018-04-02 NOTE — [facility] Group Notes (Signed)
Adult Psychoeducational Group Note  Date:  04/02/2018  Time: 4:00 PM  Group Topic/Focus: Music as a Coping Skill  Participation Level:  Active  Participation Quality:  Appropriate and Attentive  Affect:  Appropriate  Cognitive:  Alert and Oriented  Insight: Improving  Engagement in Group:  Developing/Improving  Modes of Intervention:  Discussion and Education  Additional Comments:  Patient attended group and was interactive with peers.  Tracy Bond 04/02/2018 5:00 PM 

## 2018-04-02 NOTE — Progress Notes (Signed)
Patient ID: Tracy Bond, female   DOB: 06/07/2001, 17 y.o.   MRN: [id number]  1:1 Nursing Progress Note  D: Patient is observed resting in her room in no acute distress. Per MD, patient was allowed to go to the cafeteria for lunch. Patient became agitated towards a peer on the hall but was redirected by sitter/staff. Currently patient is resting in her room. Environment is secured.   A: Patient remains on 1:1 observation per provider orders. Low fall risk precautions in place. Patient safety monitored with q15 minute safety checks.  R: Patient remains safe on the unit at this time. Will continue to monitor with 1:1 sitter, q15 min safety checks & q4 hour nursing assessments.

## 2018-04-02 NOTE — [facility] Group Notes (Signed)
Patient did not attend group.

## 2018-04-02 NOTE — Progress Notes (Signed)
The patient shared that she had a good day overall since she hung out with her peers. Her goal for tomorrow is to find out more about her discharge plans and to work on her attitude.
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
## 2018-04-02 NOTE — Progress Notes (Signed)
Recreation Therapy Notes  Date: 7.5.19 Time: 1000 Location: 500 Hall Dayroom  Group Topic: Stress Management  Goal Area(s) Addresses:  Patient will verbalize importance of using healthy stress management.  Patient will identify positive emotions associated with healthy stress management.   Intervention: Stress Management  Activity : Coloring.  LRT introduced stress management to group.  Patients were given mandala coloring pages.  Patients were to color the drawings as soft music played in the background.  Education:  Stress Management, Discharge Planning.   Education Outcome: Acknowledges edcuation/In group clarification offered/Needs additional education  Clinical Observations/Feedback:  Pt did not attend group.    Tangie Stay, LRT/CTRS         Katelyn Kohlmeyer A 04/02/2018 11:26 AM 

## 2018-04-02 NOTE — Progress Notes (Signed)
Patient ID: Tracy ShengMarian A Bond, female   DOB: 2001/07/14, 17 y.o.   MRN: [id number]  1:1 Nursing Progress Note  D: Patient is observed isolative to her room this morning. Patient did get up for morning medications but appeared angry/agitated. Patient refused to speak to writer but did take her medications and returned to her room. Patient with no further complaints at this time. Environment is secured.  A: Patient remains on 1:1 observation per provider orders. Low fall risk precautions in place. Patient safety monitored with q15 minute safety checks. Medications administered and reviewed with patient. VS, labs, and behavior monitored.  R: Patient remains safe on the unit at this time. Will continue to monitor with 1:1 sitter, q15 min safety checks & q4 hour nursing assessments.

## 2018-04-02 NOTE — Progress Notes (Signed)
Nursing 1:1 note D:Pt observed sitting in bed , eyes open. RR even and unlabored. No distress noted.  A: 1:1 observation continues for safety  R: pt remains safe

## 2018-04-02 NOTE — Progress Notes (Signed)
Patient ID: Tracy Bond, female   DOB: [birth year], 17 y.o.   MRN: [id number]  1:1 Nursing Progress Note  D: Patient allowed to go to dinner in cafeteria with 1:1 per MD verbal instruction. Patient returned from dinner crying and upset stating that, "those girls were talking bout me and being mean to me". Patient sobbing in her room. Patient provided emotional support and PRN ativan 1 mg. Environment is secured.   A: Patient remains on 1:1 observation per provider orders. Low fall risk precautions in place. Patient safety monitored with q15 minute safety checks.  R: Patient remains safe on the unit at this time. Will continue to monitor with 1:1 sitter, q15 min safety checks & q4 hour nursing assessments.
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
## 2018-04-03 NOTE — Progress Notes (Signed)
Patient ID: Tracy ShengMarian A Hobbs, female   DOB: 10-02-2000, 17 y.o.   MRN: [id number] Pt at this time is in bed resting with eyes closed. Pt does not look to be in any distress at this time. 1:1 staff is present in room with Pt at this time. 1:1 monitoring continues for Pt's safety. 15-minute safety checks also continues at this time.

## 2018-04-03 NOTE — Progress Notes (Signed)
Patient ID: Tracy Bond, female   DOB: 06/03/2001, 17 y.o.   MRN: [id number] 1:1 Note  Pt at this time is in bed resting with eyes closed. Pt does not look to be in any distress at this time. 1:1 staff is present in room with Pt at this time. 1:1 monitoring continues for Pt's safety. 15-minute safety checks also continues at this time.   

## 2018-04-03 NOTE — Progress Notes (Addendum)
Patient ID: Tracy ShengMarian A Azzara, female   DOB: 01/14/2001, 17 y.o.   MRN: [id number] 1:1 Note    Pt observed in the dayroom interacting with 1:1 staff. Pt at the time of assessment appeared to be agitated, confused and paranoid. Pt will suddenly walk away during assessment after each question and will walk back to answer the question. Pt complained of severe anxiety and tactile hallucination' "I can feel something crawling on me." 1:1 staff is present and interacting with Pt at this time. 1:1 monitoring continues for Pt's safety. Pt was med compliant. Pt attended wrap-up group.

## 2018-04-03 NOTE — Progress Notes (Signed)
D: Patient is resting quietly in bed with safety attendant/1:1 in arms distance. Per attendant, patient's behavior is appropriate and calm. Patient has been reported to be more polite to staff and peers since this morning. A: Continued 1:1 supervision. R: Patient resting quietly.

## 2018-04-03 NOTE — [facility] Group Notes (Signed)
[facility]  Date/Time:  04/03/2018  11:00AM-12:00PM  Type of Therapy and Topic:  Group Therapy:  Music and Mood  Participation Level:  Active   Description of Group: In this process group, members listened to a variety of genres of music and identified that different types of music evoke different responses.  Patients were encouraged to identify music that was soothing for them and music that was energizing for them.  Patients discussed how this knowledge can help with wellness and recovery in various ways including managing depression and anxiety as well as encouraging healthy sleep habits.    Therapeutic Goals: 1. Patients will explore the impact of different varieties of music on mood 2. Patients will verbalize the thoughts they have when listening to different types of music 3. Patients will identify music that is soothing to them as well as music that is energizing to them 4. Patients will discuss how to use this knowledge to assist in maintaining wellness and recovery 5. Patients will explore the use of music as a coping skill  Summary of Patient Progress:  At the beginning of group, patient expressed that she felt good and at the end of group said she felt "stress-free."  During group and the music playing it was quite difficult for her to listen, and she kept talking with other group members about random topics.  She was not easy to redirect and seemed irritated by it.  Therapeutic Modalities: Solution Focused Brief Therapy Activity   Ambrose MantleMareida Grossman-Orr, LCSW
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
## 2018-04-03 NOTE — [facility] Group Notes (Signed)
Adult Psychoeducational Group Note  Date:  04/03/2018 Time:  9:40 PM  Group Topic/Focus:  Wrap-Up Group:   The focus of this group is to help patients review their daily goal of treatment and discuss progress on daily workbooks.  Participation Level:  Active  Participation Quality:  Redirectable  Affect:  Appropriate  Cognitive:  Appropriate  Insight: Appropriate  Engagement in Group:  Engaged  Modes of Intervention:  Discussion  Additional Comments:  Patient attended group and volunteered to share first. Patient stated that she wants to continue to work on her anger and anxiety.   Lyndee HensenGoins, Rona Tomson R 04/03/2018, 9:40 PM
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
## 2018-04-04 MED ORDER — TRAZODONE HCL 100 MG PO TABS
100.0000 mg | ORAL_TABLET | Freq: Every evening | ORAL | Status: DC | PRN
  Administered 2018-04-04 – 2018-04-05 (×2): 100 mg via ORAL
  Filled 2018-04-04 (×2): qty 1

## 2018-04-04 NOTE — Progress Notes (Addendum)
D: "I am great!" She has a bright, positive affect. Her behavior has been appropriate all day. Respirations even and unlabored. No acute distress, resting in bed. A: Recommend considering discontinuation of 1:1 if behavior is appropriate through the night.  R: Patient contracts for safety.

## 2018-04-04 NOTE — Progress Notes (Signed)
Nursing 1:1 note D:Pt observed laying in bed with eyes closed. RR even and unlabored. No distress noted. A: 1:1 observation continues for safety  R: pt remains safe   

## 2018-04-04 NOTE — Plan of Care (Signed)
D: Pt denies SI/HI/AVH. Pt is pleasant and cooperative. Pt continues to be labile, pt stated she was doing better. Pt visible in the dayroom most of the evening.   A: Pt was offered support and encouragement. Pt was given scheduled medications. Pt was encourage to attend groups. Q 15 minute checks were done for safety.   R:Pt attends groups and interacts well with peers and staff. Pt is taking medication. Pt has no complaints.Pt receptive to treatment and safety maintained on unit.   Problem: Education: Goal: Emotional status will improve Outcome: Progressing   Problem: Education: Goal: Mental status will improve Outcome: Progressing   Problem: Safety: Goal: Periods of time without injury will increase Outcome: Progressing

## 2018-04-04 NOTE — [facility] Group Notes (Signed)
Description of Group:   [facility]  04/04/2018  1:50-2:45PM  Type of Therapy and Topic:  Group Therapy:  Coping with Emotions and exploring supports  Participation Level: Active  Description of Group:  Patients in this group were introduced to the idea of adding a variety of healthy and unhealthy coping to address the various needs in their lives. Patients were asked to first identify how they feel about their hospitalization and what they would rather be doing, discussion was opened to explore how patients can maintain without coming back to the hospital. Patients were asked to name emotions and the unhealthy ways people react to those feelings. Discussion were had related to the three ways we tend to react to emotions (Escape, Explode and Express). Patients discussed what healthy coping skills and supports could be helpful in their recovery and wellness after discharge in order to prevent future hospitalizations. Patients were provided a list of possible supports they could add and encouraged to be open to adding new supports as well as removing the unhealthy supports in their life and unhealthy coping mechanisms. An emphasis was placed on following their discharge plan including therapy and taking their medications.   Therapeutic Goals: 1)  demonstrate the importance of healthy coping and supports  2)  provide education on sources of help and positive coping techniques  3)  identify the patient's current level of coping and current support  4)  elicit commitments to add one healthy support and one new coping skill   Summary of Patient Progress: Patient reported feeling that the hospital is okay but ready to go home. The patient expressed one healthy coping technique they would use is praying, reading the bible and music. The patient was engaged most of group but did engage in some side conversation and left the room once but did return.   Therapeutic Modalities:   Motivational  Interviewing Brief Solution-Focused Therapy   Shellia CleverlyStephanie N Terese Heier, [place]CS[place]  04/04/2018 3:47 PM

## 2018-04-04 NOTE — Plan of Care (Signed)
Patient's mood has improved greatly. She is interacting positively with her peers and taking her meds as prescribed. Her goal is to "work on my attitude (up-down)."

## 2018-04-04 NOTE — Progress Notes (Signed)
Patient ID: Burnett ShengMarian A Sobieski, female   DOB: June 07, 2001, 17 y.o.   MRN: [id number] 1:1 Note  Pt at this time is in bed resting with eyes closed. Pt does not look to be in any distress at this time. 1:1 staff is present in room with Pt at this time. 1:1 monitoring continues for Pt's safety. 15-minute safety checks also continues at this time.

## 2018-04-04 NOTE — Progress Notes (Signed)
D: Patient has been pleasant and polite with staff. She is in the dayroom acting with her peers appropriately. Respirations even and unlabored. Patient in no acute distress. A: 1:1 maintained. R: Patient contracts for safety. She voices no concerns at this time.
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
## 2018-04-04 NOTE — [facility] Group Notes (Signed)
[facility] Group Notes:  (Nursing/MHT/Case Management/Adjunct)  Date:  04/04/2018  Time:  9:06 AM  Type of Therapy:  Orientation and Goals group  Participation Level:  Active  Participation Quality:  Appropriate, Attentive and Supportive  Affect:  Appropriate  Cognitive:  Appropriate  Insight:  Good and Improving  Engagement in Group:  Improving  Modes of Intervention:  Discussion, Education and Orientation  Summary of Progress/Problems: Her goal today is to work on her attitude and not be mean to others.   Tracy Bond J Tracy Bond 04/04/2018, 9:06 AM

## 2018-04-04 NOTE — Progress Notes (Signed)
Adult Psychoeducational Group Note  Date:  04/04/2018 Time:  9:36 PM  Group Topic/Focus:  Wrap-Up Group:   The focus of this group is to help patients review their daily goal of treatment and discuss progress on daily workbooks.  Participation Level:  Active  Participation Quality:  Appropriate  Affect:  Appropriate  Cognitive:  Appropriate  Insight: Appropriate  Engagement in Group:  Engaged  Modes of Intervention:  Discussion  Additional Comments: Patient attended group and said that her day was a 10. Her coping skills for today was socializing.    Paquita Printy W Eloyse Causey 04/04/2018, 9:36 PM

## 2018-04-04 NOTE — Progress Notes (Signed)
1:1 Note      Pt observed in the dayroom interacting with 1:1 staff. Pt at the time of assessment continue to be agitated, confused and paranoid. Pt complained of severe anxiety; denied SI, HI, pain or AVH. 1:1 staff is present and interacting with Pt at this time. 1:1 monitoring continues for Pt's safety. Pt was med compliant. Pt attended wrap-up group.

## 2018-04-04 NOTE — Progress Notes (Signed)
D: Patient is bright, positive and polite this morning. This is an improvement in her mood since yesterday. She slept well last night, and received sleep medication. Her appetite is good, energy normal and concentration good. She rates her depression, sense of hopelessness and anxiety are 0/10. She denies SI/HI/AVH. She denies physical symptoms or pain.  A: Patient checked q15 min, and checks reviewed, 1:1 maintained. Reviewed medication changes with patient and educated on side effects. Educated patient on importance of attending group therapy sessions and educated on several coping skills. Encouarged participation in milieu through recreation therapy and attending meals with peers. Support and encouragement provided. Fluids offered. R: Patient receptive to education on medications, and is medication compliant. Patient contracts for safety on the unit.

## 2018-04-05 MED ORDER — ARIPIPRAZOLE 10 MG PO TABS: 10.0000 mg | ORAL_TABLET | Freq: Every day | ORAL | 0 refills | Status: DC

## 2018-04-05 MED ORDER — TRAZODONE HCL 100 MG PO TABS: 100.0000 mg | ORAL_TABLET | Freq: Every evening | ORAL | 0 refills | Status: DC | PRN

## 2018-04-05 MED ORDER — CITALOPRAM HYDROBROMIDE 20 MG PO TABS: 20.0000 mg | ORAL_TABLET | Freq: Every day | ORAL | 0 refills | Status: DC
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
## 2018-04-05 NOTE — Progress Notes (Signed)
Recreation Therapy Notes  Date: 7.8.19 Time: 1000 Location: 500 Hall Dayroom  Group Topic: Triggers, Decision Making  Goal Area(s) Addresses:  Patient will be able to identify triggers. Patient will identify benefit of facing triggers. Patient will identify benefit of using coping skills for triggers post d/c.   Behavioral Response: Engaged  Intervention:  Worksheet, pencils  Activity: Triggers.  Patients will identify at least three things that trigger them.  Patients will then identify how they can avoid or reduce exposure to triggers and what strategies they can use when they can't avoid the triggers.  Education: Scientist, physiologicalDecision Making, Discharge Planning    Education Outcome: Acknowledges education  Clinical Observations/Feedback: Pt identified her triggers as "seeing bugs and not getting my way".  Pt expressed she can limit exposure to triggers by walking away, count to 10 and not responding.  Pt stated she can pray, listen to music and kill them with kindness when triggers can't be avoided.    Caroll RancherMarjette Atwell Mcdanel, LRT/CTRS    Caroll RancherLindsay, Cheray Pardi A 04/05/2018 12:05 PM

## 2018-04-05 NOTE — Progress Notes (Signed)
  [facility] Discharge Plan :  Will you be returning to the same living situation after discharge:  Yes,  home At discharge, do you have transportation home?: Yes,  mother Do you have the ability to pay for your medications: Yes,  insurance  Release of information consent forms completed and in the chart;  Patient's signature needed at discharge.  Patient to Follow up at: Follow-up Information    [facility] Follow up on 04/09/2018.   Why:  [facility] 10:[facility] Contact information: [address] [place] [address] [phone]           Next level of care provider has [facility] Link:no  Safety Planning and Suicide Prevention discussed: Yes,  yes  Have you used any form of tobacco in the last 30 days? (Cigarettes, Smokeless Tobacco, Cigars, and/or Pipes): No  Has patient been referred to the Quitline?: N/A patient is not a smoker  Patient has been referred for addiction treatment: N/A  Ida RogueRodney B Jachelle Fluty, LCSW 04/05/2018, 10:54 AM

## 2018-04-05 NOTE — Progress Notes (Signed)
Nursing 1:1 note D:Pt observed laying in bed awake. RR even and unlabored. No distress noted. A: 1:1 observation continues for safety  R: pt remains safe  

## 2018-04-05 NOTE — Progress Notes (Signed)
Pt discharged to lobby. Pt was stable and appreciative at that time. All papers and prescriptions were given and valuables returned. Verbal understanding expressed. Denies SI/HI and A/VH. Pt given opportunity to express concerns and ask questions.  
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
## 2018-04-05 NOTE — Progress Notes (Signed)
Nursing 1:1 note D:Pt observed sitting in bed eyes open . RR even and unlabored. No distress noted. A: 1:1 observation continues for safety  R: pt remains safe

## 2018-04-05 NOTE — [facility] Suicide Risk Assessment (Signed)
[facility] INPATIENT:  Family/Significant Other Suicide Prevention Education  Suicide Prevention Education:  Education Completed; No one has been identified by the patient as the family member/significant other with whom the patient will be residing, and identified as the person(s) who will aid the patient in the event of a mental health crisis (suicidal ideations/suicide attempt).  With written consent from the patient, the family member/significant other has been provided the following suicide prevention education, prior to the and/or following the discharge of the patient.  The suicide prevention education provided includes the following:  Suicide risk factors  Suicide prevention and interventions  National Suicide Hotline telephone number  [facility] assessment telephone number  [facility] Emergency Assistance 911  [facility] and/or Residential Mobile Crisis Unit telephone number  Request made of family/significant other to:  Remove weapons (e.g., guns, rifles, knives), all items previously/currently identified as safety concern.    Remove drugs/medications (over-the-counter, prescriptions, illicit drugs), all items previously/currently identified as a safety concern.  The family member/significant other verbalizes understanding of the suicide prevention education information provided.  The family member/significant other agrees to remove the items of safety concern listed above. The patient did not endorse SI at the time of admission, nor did the patient c/o SI during the stay here.  SPE not required.   Baldo DaubRodney B [facility] 04/05/2018, 10:54 AM

## 2018-04-05 NOTE — Progress Notes (Signed)
1:1 Note : Patient maintained on constant supervision for safety.  Patient is calm and pleasant.  Visible in milieu interacting with staff and peers.  Medications given as prescribed.  Routine safety checks maintained every 15 minutes.  Attended group and participated.  Patient remained safe on the unit.

## 2018-04-05 NOTE — [facility] Group Notes (Signed)
[facility] Group Notes:  (Nursing/MHT/Case Management/Adjunct)  Date:  04/05/2018  Time:  9:16 AM  Type of Therapy:  orientation and goals group  Participation Level:  Active  Participation Quality:  Appropriate  Affect:  Appropriate  Cognitive:  Appropriate  Insight:  Appropriate and Good  Engagement in Group:  Engaged and Improving  Modes of Intervention:  Clarification, Education and Orientation  Summary of Progress/Problems: Her goal today is to work on my attitude so that she can go home to her family.   Khair Chasteen J [employer] 04/05/2018, 9:16 AM

## 2018-09-15 ENCOUNTER — Encounter: Payer: Self-pay | Admitting: Family Medicine

## 2018-10-19 ENCOUNTER — Encounter: Payer: Self-pay | Admitting: Student

## 2019-03-21 ENCOUNTER — Encounter (HOSPITAL_COMMUNITY): Payer: Self-pay | Admitting: Emergency Medicine

## 2019-03-21 ENCOUNTER — Other Ambulatory Visit: Payer: Self-pay

## 2019-03-21 ENCOUNTER — Ambulatory Visit (HOSPITAL_COMMUNITY)
Admission: EM | Admit: 2019-03-21 | Discharge: 2019-03-21 | Disposition: A | Discharge status: Home / Self Care | Payer: Medicaid Other | Attending: Family Medicine | Admitting: Family Medicine

## 2019-03-21 DIAGNOSIS — J020 Streptococcal pharyngitis: Secondary | ICD-10-CM

## 2019-03-21 LAB — POCT RAPID STREP A: Streptococcus, Group A Screen (Direct): POSITIVE — AB

## 2019-03-21 MED ORDER — AMOXICILLIN 400 MG/5ML PO SUSR: 500.0000 mg | Freq: Three times a day (TID) | ORAL | 0 refills | Status: AC

## 2019-03-21 MED ORDER — AMOXICILLIN 500 MG PO CAPS: 500.0000 mg | ORAL_CAPSULE | Freq: Three times a day (TID) | ORAL | 0 refills | Status: DC

## 2019-03-21 MED ORDER — CETIRIZINE HCL 10 MG PO CAPS: 10.0000 mg | ORAL_CAPSULE | Freq: Every day | ORAL | 0 refills | Status: DC

## 2019-03-21 MED ORDER — IBUPROFEN 600 MG PO TABS: 600.0000 mg | ORAL_TABLET | Freq: Four times a day (QID) | ORAL | 0 refills | Status: DC | PRN

## 2019-03-21 NOTE — ED Triage Notes (Signed)
Pt here for sore throat x 3 days; denies fever ?

## 2019-03-21 NOTE — Discharge Instructions (Signed)

## 2019-03-21 NOTE — ED Provider Notes (Signed)
[facility]    CSN: [id number] Arrival date & time: 03/21/19  1520      History   Chief Complaint Chief Complaint  Patient presents with  . Sore Throat    HPI Tracy Bond is a 18 y.o. female history of asthma, schizophrenia, presenting today for evaluation of a sore throat.  Patient has had a sore throat for approximately 3 days.  She has also had discomfort and lymph node swelling on the right side of her neck.  She denies any fevers.  Has had hot and cold chills, but has no known measured temperature.  She denies associated congestion or cough.  Denies close sick contacts.  Denies known COVID exposure.  She has taken some Tylenol without relief of symptoms.  Mom notes that 2 days prior to onset she had episode of vomiting which is normal for her when she starts her menstrual cycle.  This is resolved.  Denies persistent nausea vomiting abdominal pain or diarrhea.  HPI  Past Medical History:  Diagnosis Date  . Asthma   . Medical history non-contributory   . Psychosis (HCC) 03/19/2017    Patient Active Problem List   Diagnosis Date Noted  . Aggressive behavior 03/30/2018  . Schizophrenia (HCC) 03/29/2018  . Psychosis (HCC) 03/19/2017  . Schizoaffective disorder, bipolar type (HCC) 03/18/2017  . Acute anxiety 03/16/2017    History reviewed. No pertinent surgical history.  OB History    Gravida  0   Para  0   Term  0   Preterm  0   AB  0   Living  0     SAB  0   TAB  0   Ectopic  0   Multiple  0   Live Births  0            Home Medications    Prior to Admission medications   Medication Sig Start Date End Date Taking? Authorizing Provider  albuterol (PROVENTIL HFA;VENTOLIN HFA) 108 (90 Base) MCG/ACT inhaler Inhale 2 puffs into the lungs every 4 (four) hours as needed for wheezing or shortness of breath. 04/08/17   Muthersbaugh, Dahlia ClientHannah, PA-C  amoxicillin (AMOXIL) 400 MG/5ML suspension Take 6.3 mLs (500 mg total) by mouth 3 (three)  times daily for 10 days. 03/21/19 03/31/19  Terrisa Curfman C, PA-C  ARIPiprazole (ABILIFY) 10 MG tablet Take 1 tablet (10 mg total) by mouth daily. 04/06/18   Starkes-Perry, Juel Burrowakia S, FNP  benztropine (COGENTIN) 0.5 MG tablet Take 0.5 mg by mouth 2 (two) times daily. 03/02/18   [provider]  Cetirizine HCl 10 MG CAPS Take 1 capsule (10 mg total) by mouth daily for 10 days. 03/21/19 03/31/19  Boss Danielsen C, PA-C  citalopram (CELEXA) 20 MG tablet Take 1 tablet (20 mg total) by mouth daily. 04/06/18   Starkes-Perry, Juel Burrowakia S, FNP  ibuprofen (ADVIL) 600 MG tablet Take 1 tablet (600 mg total) by mouth every 6 (six) hours as needed. 03/21/19   Dajah Fischman C, PA-C  LORazepam (ATIVAN) 1 MG tablet Take 1 tablet (1 mg total) by mouth at bedtime as needed for sleep. 03/24/17   Thedora HindersSevilla Saez-Benito, Miriam, MD  ranitidine (ZANTAC) 150 MG tablet Take 1 tablet (150 mg total) by mouth 2 (two) times daily. Patient not taking: Reported on 03/30/2018 04/08/17   Muthersbaugh, Dahlia ClientHannah, PA-C  traZODone (DESYREL) 100 MG tablet Take 1 tablet (100 mg total) by mouth at bedtime as needed and may repeat dose one time if needed for  sleep. 04/05/18   Maryagnes AmosStarkes-Perry, Takia S, FNP    Family History Family History  Problem Relation Age of Onset  . Mental illness Neg Hx     Social History Social History   Tobacco Use  . Smoking status: Never Smoker  . Smokeless tobacco: Never Used  Substance Use Topics  . Alcohol use: No    Comment: Pt denied; BAC not available at time of assessment  . Drug use: No    Comment: Pt denied; UDS not available at time of assessment     Allergies   Patient has no known allergies.   Review of Systems Review of Systems  Constitutional: Positive for appetite change. Negative for activity change, chills, fatigue and fever.  HENT: Positive for sore throat. Negative for congestion, ear pain, rhinorrhea, sinus pressure and trouble swallowing.   Eyes: Negative for discharge and redness.   Respiratory: Negative for cough, chest tightness and shortness of breath.   Cardiovascular: Negative for chest pain.  Gastrointestinal: Negative for abdominal pain, diarrhea, nausea and vomiting.  Musculoskeletal: Negative for myalgias.  Skin: Negative for rash.  Neurological: Negative for dizziness, light-headedness and headaches.  Hematological: Positive for adenopathy.     Physical Exam Triage Vital Signs ED Triage Vitals  Enc Vitals Group     BP 03/21/19 1539 101/64     Pulse Rate 03/21/19 1539 87     Resp 03/21/19 1539 18     Temp 03/21/19 1539 98.4 F (36.9 C)     Temp Source 03/21/19 1539 Oral     SpO2 03/21/19 1539 100 %     Weight --      Height --      Head Circumference --      Peak Flow --      Pain Score 03/21/19 1540 5     Pain Loc --      Pain Edu? --      Excl. in GC? --    No data found.  Updated Vital Signs BP 101/64 (BP Location: Right Arm)   Pulse 87   Temp 98.4 F (36.9 C) (Oral)   Resp 18   SpO2 100%   Visual Acuity Right Eye Distance:   Left Eye Distance:   Bilateral Distance:    Right Eye Near:   Left Eye Near:    Bilateral Near:     Physical Exam Vitals signs and nursing note reviewed.  Constitutional:      General: She is not in acute distress.    Appearance: She is well-developed.  HENT:     Head: Normocephalic and atraumatic.     Ears:     Comments: Bilateral ears without tenderness to palpation of external auricle, tragus and mastoid, EAC's without erythema or swelling, TM's with good bony landmarks and cone of light. Non erythematous.     Mouth/Throat:     Comments: No tonsillar swelling, posterior pharynx and tonsillar area with erythema that does extend to soft palate, no soft palate swelling, uvula midline without swelling, posterior pharynx patent Eyes:     Conjunctiva/sclera: Conjunctivae normal.  Neck:     Musculoskeletal: Neck supple.  Cardiovascular:     Rate and Rhythm: Normal rate and regular rhythm.     Heart  sounds: No murmur.  Pulmonary:     Effort: Pulmonary effort is normal. No respiratory distress.     Breath sounds: Normal breath sounds.     Comments: Breathing comfortably at rest, CTABL, no wheezing, rales or other adventitious  sounds auscultated Abdominal:     Palpations: Abdomen is soft.     Tenderness: There is no abdominal tenderness.  Skin:    General: Skin is warm and dry.  Neurological:     Mental Status: She is alert.      UC Treatments / Results  Labs (all labs ordered are listed, but only abnormal results are displayed) Labs Reviewed  POCT RAPID STREP A - Abnormal; Notable for the following components:      Result Value   Streptococcus, Group A Screen (Direct) POSITIVE (*)    All other components within normal limits    EKG None  Radiology No results found.  Procedures Procedures (including critical care time)  Medications Ordered in UC Medications - No data to display  Initial Impression / Assessment and Plan / UC Course  I have reviewed the triage vital signs and the nursing notes.  Pertinent labs & imaging results that were available during my care of the patient were reviewed by me and considered in my medical decision making (see chart for details).     Strep test positive, will treat with amoxicillin x10 days.  Discussed further symptomatic and supportive care.  Push fluids.  Discussed strict return precautions. Patient verbalized understanding and is agreeable with plan.  Final Clinical Impressions(s) / UC Diagnoses   Final diagnoses:  Strep pharyngitis     Discharge Instructions     Sore Throat  Your rapid strep test was positive today. We will treat you for strep throat with an antibiotic. Please take Amoxicillin as prescribed.   Please continue Tylenol or Ibuprofen for fever and pain. May try salt water gargles, cepacol lozenges, throat spray, or OTC cold relief medicine for throat discomfort. If you also have congestion take a daily  anti-histamine like Zyrtec, Claritin, and a oral decongestant to help with post nasal drip that may be irritating your throat.   Stay hydrated and drink plenty of fluids to keep your throat coated relieve irritation.    ED Prescriptions    Medication Sig Dispense Auth. Provider   amoxicillin (AMOXIL) 500 MG capsule  (Status: Discontinued) Take 1 capsule (500 mg total) by mouth 3 (three) times daily for 10 days. 30 capsule Armonie Staten C, PA-C   ibuprofen (ADVIL) 600 MG tablet Take 1 tablet (600 mg total) by mouth every 6 (six) hours as needed. 30 tablet Aadan Chenier C, PA-C   Cetirizine HCl 10 MG CAPS Take 1 capsule (10 mg total) by mouth daily for 10 days. 10 capsule Cortavius Montesinos C, PA-C   amoxicillin (AMOXIL) 400 MG/5ML suspension Take 6.3 mLs (500 mg total) by mouth 3 (three) times daily for 10 days. 200 mL Jahne Krukowski C, PA-C     Controlled Substance Prescriptions [place] Controlled Substance Registry consulted? Not Applicable   Janith Lima, [place] 03/21/19 1628

## 2020-02-13 ENCOUNTER — Encounter: Payer: Self-pay | Admitting: Pediatrics

## 2020-08-15 ENCOUNTER — Emergency Department (HOSPITAL_COMMUNITY)
Admission: EM | Admit: 2020-08-15 | Discharge: 2020-08-16 | Disposition: A | Discharge status: Home / Self Care | Payer: Medicaid Other | Attending: Emergency Medicine | Admitting: Emergency Medicine

## 2020-08-15 ENCOUNTER — Other Ambulatory Visit: Payer: Self-pay

## 2020-08-15 ENCOUNTER — Encounter (HOSPITAL_COMMUNITY): Payer: Self-pay | Admitting: Emergency Medicine

## 2020-08-15 DIAGNOSIS — F250 Schizoaffective disorder, bipolar type: Secondary | ICD-10-CM | POA: Diagnosis present

## 2020-08-15 DIAGNOSIS — Z20822 Contact with and (suspected) exposure to covid-19: Secondary | ICD-10-CM | POA: Diagnosis not present

## 2020-08-15 DIAGNOSIS — J45909 Unspecified asthma, uncomplicated: Secondary | ICD-10-CM | POA: Insufficient documentation

## 2020-08-15 DIAGNOSIS — F209 Schizophrenia, unspecified: Secondary | ICD-10-CM

## 2020-08-15 LAB — CBC
HCT: 38.6 % (ref 36.0–46.0)
Hemoglobin: 12.3 g/dL (ref 12.0–15.0)
MCH: 28.3 pg (ref 26.0–34.0)
MCHC: 31.9 g/dL (ref 30.0–36.0)
MCV: 88.9 fL (ref 80.0–100.0)
Platelets: 260 10*3/uL (ref 150–400)
RBC: 4.34 MIL/uL (ref 3.87–5.11)
RDW: 13.2 % (ref 11.5–15.5)
WBC: 12.2 10*3/uL — ABNORMAL HIGH (ref 4.0–10.5)
nRBC: 0 % (ref 0.0–0.2)

## 2020-08-15 LAB — ETHANOL: Alcohol, Ethyl (B): <10 mg/dL (ref <10)

## 2020-08-15 LAB — RAPID URINE DRUG SCREEN, HOSP PERFORMED
Amphetamines: NOT DETECTED
Barbiturates: NOT DETECTED
Benzodiazepines: NOT DETECTED
Cocaine: NOT DETECTED
Opiates: NOT DETECTED
Tetrahydrocannabinol: NOT DETECTED

## 2020-08-15 LAB — COMPREHENSIVE METABOLIC PANEL
ALT: 14 U/L (ref 0–44)
AST: 17 U/L (ref 15–41)
Albumin: 4.2 g/dL (ref 3.5–5.0)
Alkaline Phosphatase: 62 U/L (ref 38–126)
Anion gap: 8 (ref 5–15)
BUN: 10 mg/dL (ref 6–20)
CO2: 21 mmol/L — ABNORMAL LOW (ref 22–32)
Calcium: 9.2 mg/dL (ref 8.9–10.3)
Chloride: 106 mmol/L (ref 98–111)
Creatinine, Ser: 0.84 mg/dL (ref 0.44–1.00)
GFR, Estimated: >60 mL/min (ref >60)
Glucose, Bld: 98 mg/dL (ref 70–99)
Potassium: 3.5 mmol/L (ref 3.5–5.1)
Sodium: 135 mmol/L (ref 135–145)
Total Bilirubin: 0.3 mg/dL (ref 0.3–1.2)
Total Protein: 8 g/dL (ref 6.5–8.1)

## 2020-08-15 LAB — ACETAMINOPHEN LEVEL: Acetaminophen (Tylenol), Serum: <10 ug/mL — ABNORMAL LOW (ref 10–30)

## 2020-08-15 LAB — SALICYLATE LEVEL: Salicylate Lvl: <7 mg/dL — ABNORMAL LOW (ref 7.0–30.0)

## 2020-08-15 LAB — RESPIRATORY PANEL BY RT PCR (FLU A&B, COVID)
Influenza A by PCR: NEGATIVE
Influenza B by PCR: NEGATIVE
SARS Coronavirus 2 by RT PCR: NEGATIVE

## 2020-08-15 LAB — I-STAT BETA HCG BLOOD, ED (MC, WL, AP ONLY): I-stat hCG, quantitative: <5 m[IU]/mL (ref <5)

## 2020-08-15 MED ORDER — ARIPIPRAZOLE 10 MG PO TABS
10.0000 mg | ORAL_TABLET | Freq: Every day | ORAL | Status: DC
  Administered 2020-08-15: 10 mg via ORAL
  Filled 2020-08-15 (×2): qty 1

## 2020-08-15 MED ORDER — CITALOPRAM HYDROBROMIDE 10 MG PO TABS
20.0000 mg | ORAL_TABLET | Freq: Every day | ORAL | Status: DC
  Administered 2020-08-15: 20 mg via ORAL
  Filled 2020-08-15: qty 2

## 2020-08-15 MED ORDER — ALBUTEROL SULFATE HFA 108 (90 BASE) MCG/ACT IN AERS: 2.0000 | INHALATION_SPRAY | RESPIRATORY_TRACT | Status: DC | PRN

## 2020-08-15 MED ORDER — HYDROXYZINE HCL 25 MG PO TABS
25.0000 mg | ORAL_TABLET | Freq: Two times a day (BID) | ORAL | Status: DC | PRN
  Administered 2020-08-16: 25 mg via ORAL
  Filled 2020-08-15: qty 1

## 2020-08-15 MED ORDER — AMANTADINE HCL 100 MG PO CAPS
100.0000 mg | ORAL_CAPSULE | Freq: Every day | ORAL | Status: DC
  Administered 2020-08-15: 100 mg via ORAL
  Filled 2020-08-15 (×3): qty 1

## 2020-08-15 MED ORDER — LORATADINE 10 MG PO TABS
10.0000 mg | ORAL_TABLET | Freq: Every day | ORAL | Status: DC
  Administered 2020-08-16: 10 mg via ORAL
  Filled 2020-08-15: qty 1

## 2020-08-15 NOTE — ED Triage Notes (Signed)
Pt here with schizophrenic episode pt is seeing thins and hearing things that are not there. Pt has been out of her Abilify for 2 weeks and they feel like this is why this is happening. Pt is a&Ox4 VSS. Mom is sitting with pt in the lobby.
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
## 2020-08-15 NOTE — ED Notes (Signed)
Patient placed in burgundy scrubs-Monique,RN

## 2020-08-15 NOTE — ED Notes (Signed)
Talking to tts

## 2020-08-15 NOTE — ED Provider Notes (Signed)
[facility] EMERGENCY DEPARTMENT Provider Note   CSN: [id number] Arrival date & time: 08/15/20  1739     History Chief Complaint  Patient presents with  . hearing voices  . Medical Clearance    Tracy Bond is a 19 y.o. female with a past medical history of schizophrenia, anxiety, who presents today for evaluation of hallucinations.  History is primarily obtained from patient's mother who is in the room.  She reports that patient had been out of her Abilify for 2 weeks however restarted it a few days ago.  Patient began having auditory and visual hallucinations yesterday that got significantly worse today.  Mother reports that yesterday and leading up to this patient was very elevated in mood however is now tearful and appears to be having "an episode."   Aside from missing her Abilify that is since been restarted no other medication changes.  Patient has otherwise been well.  No known sick contacts.  HPI     Past Medical History:  Diagnosis Date  . Asthma   . Medical history non-contributory   . Psychosis (HCC) 03/19/2017    Patient Active Problem List   Diagnosis Date Noted  . Aggressive behavior 03/30/2018  . Schizophrenia (HCC) 03/29/2018  . Psychosis (HCC) 03/19/2017  . Schizoaffective disorder, bipolar type (HCC) 03/18/2017  . Acute anxiety 03/16/2017    History reviewed. No pertinent surgical history.   OB History    Gravida  0   Para  0   Term  0   Preterm  0   AB  0   Living  0     SAB  0   TAB  0   Ectopic  0   Multiple  0   Live Births  0           Family History  Problem Relation Age of Onset  . Mental illness Neg Hx     Social History   Tobacco Use  . Smoking status: Never Smoker  . Smokeless tobacco: Never Used  Substance Use Topics  . Alcohol use: No    Comment: Pt denied; BAC not available at time of assessment  . Drug use: No    Comment: Pt denied; UDS not available at time of assessment     Home Medications Prior to Admission medications   Medication Sig Start Date End Date Taking? Authorizing Provider  albuterol (PROVENTIL HFA;VENTOLIN HFA) 108 (90 Base) MCG/ACT inhaler Inhale 2 puffs into the lungs every 4 (four) hours as needed for wheezing or shortness of breath. 04/08/17  Yes Muthersbaugh, Dahlia Client, PA-C  amantadine (SYMMETREL) 100 MG capsule Take 100 mg by mouth at bedtime.    Yes [provider]  ARIPiprazole (ABILIFY) 10 MG tablet Take 1 tablet (10 mg total) by mouth daily. Patient taking differently: Take 10 mg by mouth at bedtime.  04/06/18  Yes Starkes-Perry, Juel Burrow, FNP  cetirizine (ZYRTEC) 10 MG tablet Take 10 mg by mouth daily as needed for allergies.   Yes [provider]  citalopram (CELEXA) 20 MG tablet Take 1 tablet (20 mg total) by mouth daily. Patient taking differently: Take 20 mg by mouth at bedtime.  04/06/18  Yes Starkes-Perry, Juel Burrow, FNP  hydrOXYzine (ATARAX/VISTARIL) 25 MG tablet Take 25 mg by mouth 2 (two) times daily as needed for anxiety.   Yes [provider]    Allergies    Patient has no known allergies.  Review of Systems   Review of Systems  Physical Exam Updated Vital Signs BP 139/78   Pulse 67   Temp 99 F (37.2 C) (Oral)   Resp 16   Ht 5\' 7"  (1.702 m)   Wt 68.9 kg   SpO2 100%   BMI 23.79 kg/m   Physical Exam Vitals and nursing note reviewed.  Constitutional:      General: She is not in acute distress.    Appearance: She is well-developed. She is not diaphoretic.  HENT:     Head: Normocephalic and atraumatic.  Eyes:     General: No scleral icterus.       Right eye: No discharge.        Left eye: No discharge.     Conjunctiva/sclera: Conjunctivae normal.  Cardiovascular:     Rate and Rhythm: Normal rate and regular rhythm.  Pulmonary:     Effort: Pulmonary effort is normal. No respiratory distress.     Breath sounds: No stridor.  Abdominal:     General: There is no distension.   Musculoskeletal:        General: No deformity.     Cervical back: Normal range of motion.  Skin:    General: Skin is warm and dry.  Neurological:     Mental Status: She is alert.     Motor: No abnormal muscle tone.     Comments: Patient is awake and alert.  Speech is not slurred.   Psychiatric:        Attention and Perception: She is inattentive. She perceives auditory and visual hallucinations.        Mood and Affect: Affect is blunt and tearful.        Speech: Speech is tangential.        Behavior: Behavior is withdrawn.     ED Results / Procedures / Treatments   Labs (all labs ordered are listed, but only abnormal results are displayed) Labs Reviewed  COMPREHENSIVE METABOLIC PANEL - Abnormal; Notable for the following components:      Result Value   CO2 21 (*)    All other components within normal limits  SALICYLATE LEVEL - Abnormal; Notable for the following components:   Salicylate Lvl <7.0 (*)    All other components within normal limits  ACETAMINOPHEN LEVEL - Abnormal; Notable for the following components:   Acetaminophen (Tylenol), Serum <10 (*)    All other components within normal limits  CBC - Abnormal; Notable for the following components:   WBC 12.2 (*)    All other components within normal limits  RESPIRATORY PANEL BY RT PCR (FLU A&B, COVID)  ETHANOL  RAPID URINE DRUG SCREEN, HOSP PERFORMED  I-STAT BETA HCG BLOOD, ED ([facility], [facility], [facility] ONLY)    EKG EKG Interpretation  Date/Time:  Wednesday August 15 2020 21:01:08 EST Ventricular Rate:  84 PR Interval:    QRS Duration: 84 QT Interval:  387 QTC Calculation: 458 R Axis:   66 Text Interpretation: Sinus rhythm Normal ECG No significant change since last tracing Confirmed by [birth year] [phone]) on 08/15/2020 9:20:28 PM   Radiology No results found.  Procedures Procedures (including critical care time)  Medications Ordered in ED Medications  ARIPiprazole (ABILIFY) tablet 10 mg (has no  administration in time range)  citalopram (CELEXA) tablet 20 mg (has no administration in time range)  hydrOXYzine (ATARAX/VISTARIL) tablet 25 mg (has no administration in time range)  loratadine (CLARITIN) tablet 10 mg (has no administration in time range)  amantadine (SYMMETREL) capsule 100 mg (has no administration in time  range)  albuterol (VENTOLIN HFA) 108 (90 Base) MCG/ACT inhaler 2 puff (has no administration in time range)    ED Course  I have reviewed the triage vital signs and the nursing notes.  Pertinent labs & imaging results that were available during my care of the patient were reviewed by me and considered in my medical decision making (see chart for details).    MDM Rules/Calculators/A&P                         Patient is a 19 year old woman who presents today for medical clearance.  Patient presents with her mother.  Patient had issues getting her Abilify and was off it for about 2 weeks however restarted a few days ago.  Mother notes that yesterday she became very low mood and has been having auditory and visual hallucinations.  Patient is blunted but tearful and is withdrawn.  Her speech is very quiet and tangential and she has difficulty answering direct questions.  Unable to assess SI or HI.  Salicylate, acetaminophen, and ethanol are all undetected.  Pregnancy test is negative.  CBC shows mild leukocytosis which I do not think is clinically significant.  Her blood pressure is elevated here however I suspect that this is secondary due to her being upset and tearful over her hallucinations.  CMP is unremarkable.  Covid test is ordered as is baseline EKG. At this time patient is medically clear for psychiatric evaluation and disposition.  TTS consult is ordered.  Note: Portions of this report may have been transcribed using voice recognition software. Every effort was made to ensure accuracy; however, inadvertent computerized transcription errors may be  present   Final Clinical Impression(s) / ED Diagnoses Final diagnoses:  Schizophrenia, unspecified type [facility])    Rx / DC Orders ED Discharge Orders    None       Norman Clay 08/15/20 2257    Pollyann Savoy, MD 08/15/20 2320

## 2020-08-15 NOTE — ED Notes (Signed)
Assisted pt to restroom to collect UDS, pt dropped cup in the toilet by accident and was very emotional about dsoing so. Tech reassured her it was ok we will just try again later.

## 2020-08-16 ENCOUNTER — Other Ambulatory Visit: Payer: Self-pay

## 2020-08-16 ENCOUNTER — Inpatient Hospital Stay (HOSPITAL_COMMUNITY)
Admission: AD | Admit: 2020-08-16 | Discharge: 2020-08-21 | DRG: 885 | Disposition: A | Discharge status: Home / Self Care | Payer: Medicaid Other | Source: Intra-hospital | Attending: Psychiatry | Admitting: Psychiatry

## 2020-08-16 ENCOUNTER — Encounter (HOSPITAL_COMMUNITY): Payer: Self-pay | Admitting: Psychiatry

## 2020-08-16 ENCOUNTER — Other Ambulatory Visit: Payer: Self-pay | Admitting: Psychiatric/Mental Health

## 2020-08-16 DIAGNOSIS — F819 Developmental disorder of scholastic skills, unspecified: Secondary | ICD-10-CM | POA: Diagnosis present

## 2020-08-16 DIAGNOSIS — F79 Unspecified intellectual disabilities: Secondary | ICD-10-CM | POA: Diagnosis present

## 2020-08-16 DIAGNOSIS — N3944 Nocturnal enuresis: Secondary | ICD-10-CM | POA: Diagnosis not present

## 2020-08-16 DIAGNOSIS — F250 Schizoaffective disorder, bipolar type: Principal | ICD-10-CM | POA: Diagnosis present

## 2020-08-16 DIAGNOSIS — G4700 Insomnia, unspecified: Secondary | ICD-10-CM | POA: Diagnosis present

## 2020-08-16 DIAGNOSIS — Z79899 Other long term (current) drug therapy: Secondary | ICD-10-CM

## 2020-08-16 DIAGNOSIS — F419 Anxiety disorder, unspecified: Secondary | ICD-10-CM | POA: Diagnosis present

## 2020-08-16 DIAGNOSIS — J45909 Unspecified asthma, uncomplicated: Secondary | ICD-10-CM | POA: Diagnosis present

## 2020-08-16 DIAGNOSIS — R000 Tachycardia, unspecified: Secondary | ICD-10-CM | POA: Diagnosis not present

## 2020-08-16 MED ORDER — ACETAMINOPHEN 325 MG PO TABS
650.0000 mg | ORAL_TABLET | Freq: Four times a day (QID) | ORAL | Status: DC | PRN
  Administered 2020-08-20: 650 mg via ORAL
  Filled 2020-08-16: qty 2

## 2020-08-16 MED ORDER — MAGNESIUM HYDROXIDE 400 MG/5ML PO SUSP: 30.0000 mL | Freq: Every day | ORAL | Status: DC | PRN

## 2020-08-16 MED ORDER — LORAZEPAM 1 MG PO TABS
1.0000 mg | ORAL_TABLET | ORAL | Status: AC | PRN
  Administered 2020-08-17: 1 mg via ORAL
  Filled 2020-08-16: qty 1

## 2020-08-16 MED ORDER — ARIPIPRAZOLE 10 MG PO TABS
10.0000 mg | ORAL_TABLET | Freq: Every day | ORAL | Status: DC
  Administered 2020-08-16 – 2020-08-17 (×2): 10 mg via ORAL
  Filled 2020-08-16 (×5): qty 1

## 2020-08-16 MED ORDER — ZIPRASIDONE MESYLATE 20 MG IM SOLR
20.0000 mg | INTRAMUSCULAR | Status: AC | PRN
  Administered 2020-08-17: 20 mg via INTRAMUSCULAR
  Filled 2020-08-16: qty 20

## 2020-08-16 MED ORDER — LORAZEPAM 1 MG PO TABS
2.0000 mg | ORAL_TABLET | Freq: Once | ORAL | Status: AC
  Administered 2020-08-16: 2 mg via ORAL
  Filled 2020-08-16: qty 2

## 2020-08-16 MED ORDER — TRAZODONE HCL 50 MG PO TABS
50.0000 mg | ORAL_TABLET | Freq: Every evening | ORAL | Status: DC | PRN
  Administered 2020-08-16 – 2020-08-18 (×3): 50 mg via ORAL
  Filled 2020-08-16 (×3): qty 1

## 2020-08-16 MED ORDER — CITALOPRAM HYDROBROMIDE 20 MG PO TABS
20.0000 mg | ORAL_TABLET | Freq: Every day | ORAL | Status: DC
  Administered 2020-08-16 – 2020-08-20 (×5): 20 mg via ORAL
  Filled 2020-08-16 (×9): qty 1

## 2020-08-16 MED ORDER — LORATADINE 10 MG PO TABS
10.0000 mg | ORAL_TABLET | Freq: Every day | ORAL | Status: DC
  Administered 2020-08-17 – 2020-08-21 (×5): 10 mg via ORAL
  Filled 2020-08-16 (×8): qty 1

## 2020-08-16 MED ORDER — HYDROXYZINE HCL 25 MG PO TABS
25.0000 mg | ORAL_TABLET | Freq: Three times a day (TID) | ORAL | Status: DC | PRN
  Administered 2020-08-16 – 2020-08-21 (×6): 25 mg via ORAL
  Filled 2020-08-16 (×6): qty 1

## 2020-08-16 MED ORDER — ALUM & MAG HYDROXIDE-SIMETH 200-200-20 MG/5ML PO SUSP: 30.0000 mL | ORAL | Status: DC | PRN

## 2020-08-16 MED ORDER — AMANTADINE HCL 100 MG PO CAPS
100.0000 mg | ORAL_CAPSULE | Freq: Two times a day (BID) | ORAL | Status: DC
  Administered 2020-08-16 – 2020-08-21 (×11): 100 mg via ORAL
  Filled 2020-08-16 (×15): qty 1

## 2020-08-16 MED ORDER — ALBUTEROL SULFATE HFA 108 (90 BASE) MCG/ACT IN AERS: 1.0000 | INHALATION_SPRAY | Freq: Four times a day (QID) | RESPIRATORY_TRACT | Status: DC | PRN

## 2020-08-16 MED ORDER — OLANZAPINE 5 MG PO TBDP
5.0000 mg | ORAL_TABLET | Freq: Three times a day (TID) | ORAL | Status: DC | PRN
  Administered 2020-08-17 (×2): 5 mg via ORAL
  Filled 2020-08-16 (×2): qty 1

## 2020-08-16 NOTE — ED Notes (Signed)
Lunch Tray Ordered @ 1000. 

## 2020-08-16 NOTE — BH Assessment (Signed)
Patient was seen for re-assessment. Patient states that she did not sleep last night.  Her affect is blunted and flat.  Continues to have complaints about tactile and auditory hallucinations. Patient was scratching throughout her assessment.  Patient was tearful and during her re-assessment.  Patient states, "I just want to go home to be with my family."  Explained to patient that she needs to be well when she returns home and that she was not currently stable enough to return home.  Social Work to continue to seek placement for patient.

## 2020-08-16 NOTE — Progress Notes (Signed)
Pt accepted to [facility], bed 502-1   Reola Calkins, NP is the accepting provider.    Dr. Jola Babinski is the attending provider.    Call report to [phone]    [facility] ED notified.     Pt is voluntary and will be transported by [employer]   Pt is scheduled to arrive at [facility] at 2pm.   Pt's legal guardian/mother, [name] [phone]) was informed of disposition and will come to [place] [facility] at 2pm to sign consent for treatment paperwork.   Wells Guiles, MSW, LCSW, LCAS Clinical Social Worker II Disposition CSW [phone]

## 2020-08-16 NOTE — Progress Notes (Signed)
Pt is paranoid, fearful, suspicious, and actively responding to AVH. Pt's mood is labile and she has some thought blocking at times. Pt was observed to have been in the shower for a long time. Pt is childlike and needs continuous directions. Pt's washcloth was dirty, but she was tearful and said that it's clean. Informed pt that she used it to wash herself and that it's dirty. Pt had to be asked to wash off and then provided towels to dry off. Pt was obsessive between what was clean and dirty. Pt stops occasionally during the conversation mumbling to herself like "focus Yolette." Pt is scared to be in her room by herself but staff reassured her that she is safe since we are performing checks every 15 minutes. She has had to be redirected to her bed several times. She came out of her room crying asking for her mother. Pt was reoriented to her surroundings and told that she can call her mother in the morning. She also said things like she can't sleep without a scarf around her head and she was asking for her clothes. Pt didn't come in with any clothes that can be allowed on the unit. Pt was provided two hospital gowns. Informed pt that her family members can bring her some clothes. Q 15 min safety checks continue. Pt's safety has been maintained.

## 2020-08-16 NOTE — ED Notes (Signed)
Pt will be transferred to [facility] after shift change around 2000 due to scheduling emergency at [facility].

## 2020-08-16 NOTE — ED Notes (Signed)
Vitals done @ 6 

## 2020-08-16 NOTE — Progress Notes (Signed)
Admission Note:   Report received from [place], RN at [facility] ED.   Pt is a 19 y/o female, with prior admissions to [facility], admitted to [facility] unit. Per report pt's mother, pt's legal guardian, brought pt to the hospital due to psychosis. Pt stopped taking her Abilify per report two weeks ago. Pt lives with her mother, at another relative's home. Pt is paranoid, suspicious, responding to auditory and visual hallucinations, word salad noted, disorganized, thought blocking, labile mood, tearful, fearful, stares inappropriately. Pt is able to follow directions, but often staff must repeat the directions a few times before pt will respond. Pt's concentration is poor, insight and judgement is poor. Pt is unable when asked come up with a reason for admission. Per ED report pt was given Ativan in the ED, and has been calm and cooperative. Per nurse report pt did not sleep last night suffering from insomnia, was given Ativan and Vistaril earlier, and also her antipsychotic in ED. No aggression or violent behavior noted. Pt was unable to sign admission paperwork, was shown to her room, was compliant and cooperative with body check which was unremarkable, witnessed by the MHT/UC. Will continue to monitor pt per Q15 minute face checks and monitor for safety and progress.

## 2020-08-16 NOTE — ED Provider Notes (Signed)
Emergency Medicine Observation Re-evaluation Note  Tracy Bond is a 19 y.o. female, seen on rounds today.  Pt initially presented to the ED for complaints of hearing voices and Medical Clearance Currently, the patient is awaiting placement.  Physical Exam  BP (!) 155/79 (BP Location: Right Arm)   Pulse 69   Temp 99.1 F (37.3 C) (Oral)   Resp 20   Ht 5\' 7"  (1.702 m)   Wt 68.9 kg   SpO2 97%   BMI 23.79 kg/m  Physical Exam General: hallucinating Cardiac: normal HR Lungs: no increased WOB Psych: hallucinating  ED Course / MDM  EKG:EKG Interpretation  Date/Time:  Wednesday August 15 2020 21:01:08 EST Ventricular Rate:  84 PR Interval:    QRS Duration: 84 QT Interval:  387 QTC Calculation: 458 R Axis:   66 Text Interpretation: Sinus rhythm Normal ECG No significant change since last tracing Confirmed by 1980 [phone]) on 08/15/2020 9:20:28 PM Also confirmed by 08/17/2020 [phone]), editor ([phone]  on 08/16/2020 8:10:02 AM    I have reviewed the labs performed to date as well as medications administered while in observation.  Recent changes in the last 24 hours include home meds re-ordered.  Plan  Current plan is for psychiatric admission. Patient is not under full IVC at this time.   08/18/2020, MD 08/16/20 1029

## 2020-08-16 NOTE — ED Notes (Signed)
RN informed mother that transport has arrived for patient to be taken to [facility]

## 2020-08-16 NOTE — ED Notes (Signed)
Ordered breakfast 

## 2020-08-17 DIAGNOSIS — F250 Schizoaffective disorder, bipolar type: Secondary | ICD-10-CM | POA: Diagnosis not present

## 2020-08-17 MED ORDER — LORAZEPAM 1 MG PO TABS
ORAL_TABLET | ORAL | Status: AC
  Filled 2020-08-17: qty 1

## 2020-08-17 MED ORDER — LORAZEPAM 1 MG PO TABS
1.0000 mg | ORAL_TABLET | Freq: Four times a day (QID) | ORAL | Status: DC | PRN
  Administered 2020-08-18 – 2020-08-21 (×5): 1 mg via ORAL
  Filled 2020-08-17 (×6): qty 1

## 2020-08-17 MED ORDER — OLANZAPINE 10 MG PO TBDP
10.0000 mg | ORAL_TABLET | Freq: Three times a day (TID) | ORAL | Status: DC | PRN
  Administered 2020-08-17 – 2020-08-21 (×3): 10 mg via ORAL
  Filled 2020-08-17 (×3): qty 1

## 2020-08-17 MED ORDER — OLANZAPINE 2.5 MG PO TABS
2.5000 mg | ORAL_TABLET | ORAL | Status: AC
  Administered 2020-08-17: 2.5 mg via ORAL
  Filled 2020-08-17 (×2): qty 1

## 2020-08-17 MED ORDER — ZIPRASIDONE MESYLATE 20 MG IM SOLR: 20.0000 mg | Freq: Four times a day (QID) | INTRAMUSCULAR | Status: DC | PRN

## 2020-08-17 NOTE — Tx Team (Signed)
Interdisciplinary Treatment and Diagnostic Plan Update  08/17/2020 Time of Session: 9:35am Tracy Bond MRN: [id number]  Principal Diagnosis: <principal problem not specified>  Secondary Diagnoses: Active Problems:   Schizoaffective disorder, bipolar type ([place])   Current Medications:  Current Facility-Administered Medications  Medication Dose Route Frequency Provider Last Rate Last Admin  . acetaminophen (TYLENOL) tablet 650 mg  650 mg Oral Q6H PRN Connye Burkitt, NP      . albuterol (VENTOLIN HFA) 108 (90 Base) MCG/ACT inhaler 1-2 puff  1-2 puff Inhalation Q6H PRN Connye Burkitt, NP      . alum & mag hydroxide-simeth (MAALOX/MYLANTA) 200-200-20 MG/5ML suspension 30 mL  30 mL Oral Q4H PRN Connye Burkitt, NP      . amantadine (SYMMETREL) capsule 100 mg  100 mg Oral BID Connye Burkitt, NP   100 mg at 08/17/20 0947  . ARIPiprazole (ABILIFY) tablet 10 mg  10 mg Oral QHS Connye Burkitt, NP   10 mg at 08/16/20 2302  . citalopram (CELEXA) tablet 20 mg  20 mg Oral QHS Connye Burkitt, NP   20 mg at 08/16/20 2301  . hydrOXYzine (ATARAX/VISTARIL) tablet 25 mg  25 mg Oral TID PRN Connye Burkitt, NP   25 mg at 08/16/20 2301  . loratadine (CLARITIN) tablet 10 mg  10 mg Oral Daily Connye Burkitt, NP   10 mg at 08/17/20 0947  . magnesium hydroxide (MILK OF MAGNESIA) suspension 30 mL  30 mL Oral Daily PRN Connye Burkitt, NP      . OLANZapine zydis (ZYPREXA) disintegrating tablet 5 mg  5 mg Oral Q8H PRN Connye Burkitt, NP   5 mg at 08/17/20 0947  . traZODone (DESYREL) tablet 50 mg  50 mg Oral QHS PRN,MR X 1 Connye Burkitt, NP   50 mg at 08/16/20 2302   PTA Medications: Medications Prior to Admission  Medication Sig Dispense Refill Last Dose  . albuterol (PROVENTIL HFA;VENTOLIN HFA) 108 (90 Base) MCG/ACT inhaler Inhale 2 puffs into the lungs every 4 (four) hours as needed for wheezing or shortness of breath. 1 Inhaler 3   . amantadine (SYMMETREL) 100 MG capsule Take 100 mg by mouth at bedtime.       . ARIPiprazole (ABILIFY) 10 MG tablet Take 1 tablet (10 mg total) by mouth daily. (Patient taking differently: Take 10 mg by mouth at bedtime. ) 30 tablet 0   . cetirizine (ZYRTEC) 10 MG tablet Take 10 mg by mouth daily as needed for allergies.     . citalopram (CELEXA) 20 MG tablet Take 1 tablet (20 mg total) by mouth daily. (Patient taking differently: Take 20 mg by mouth at bedtime. ) 30 tablet 0   . hydrOXYzine (ATARAX/VISTARIL) 25 MG tablet Take 25 mg by mouth 2 (two) times daily as needed for anxiety.       Patient Stressors:    Patient Strengths:    Treatment Modalities: Medication Management, Group therapy, Case management,  1 to 1 session with clinician, Psychoeducation, Recreational therapy.   Physician Treatment Plan for Primary Diagnosis: <principal problem not specified> Long Term Goal(s):     Short Term Goals:    Medication Management: Evaluate patient's response, side effects, and tolerance of medication regimen.  Therapeutic Interventions: 1 to 1 sessions, Unit Group sessions and Medication administration.  Evaluation of Outcomes: Not Met  Physician Treatment Plan for Secondary Diagnosis: Active Problems:   Schizoaffective disorder, bipolar type ([place])  Long Term Goal(s):  Short Term Goals:       Medication Management: Evaluate patient's response, side effects, and tolerance of medication regimen.  Therapeutic Interventions: 1 to 1 sessions, Unit Group sessions and Medication administration.  Evaluation of Outcomes: Not Met   RN Treatment Plan for Primary Diagnosis: <principal problem not specified> Long Term Goal(s): Knowledge of disease and therapeutic regimen to maintain health will improve  Short Term Goals: Ability to remain free from injury will improve, Ability to participate in decision making will improve, Ability to verbalize feelings will improve, Ability to identify and develop effective coping behaviors will improve and Compliance with  prescribed medications will improve  Medication Management: RN will administer medications as ordered by provider, will assess and evaluate patient's response and provide education to patient for prescribed medication. RN will report any adverse and/or side effects to prescribing provider.  Therapeutic Interventions: 1 on 1 counseling sessions, Psychoeducation, Medication administration, Evaluate responses to treatment, Monitor vital signs and CBGs as ordered, Perform/monitor CIWA, COWS, AIMS and Fall Risk screenings as ordered, Perform wound care treatments as ordered.  Evaluation of Outcomes: Not Met   LCSW Treatment Plan for Primary Diagnosis: <principal problem not specified> Long Term Goal(s): Safe transition to appropriate next level of care at discharge, Engage patient in therapeutic group addressing interpersonal concerns.  Short Term Goals: Engage patient in aftercare planning with referrals and resources, Increase social support, Increase ability to appropriately verbalize feelings, Increase emotional regulation, Identify triggers associated with mental health/substance abuse issues and Increase skills for wellness and recovery  Therapeutic Interventions: Assess for all discharge needs, 1 to 1 time with Social worker, Explore available resources and support systems, Assess for adequacy in community support network, Educate family and significant other(s) on suicide prevention, Complete Psychosocial Assessment, Interpersonal group therapy.  Evaluation of Outcomes: Not Met   Progress in Treatment: Attending groups: No. Participating in groups: No. Taking medication as prescribed: Yes. Toleration medication: Yes. Family/Significant other contact made: No, will contact:  if consent is given Patient understands diagnosis: Yes. Discussing patient identified problems/goals with staff: Yes. Medical problems stabilized or resolved: Yes. Denies suicidal/homicidal ideation:  Yes. Issues/concerns per patient self-inventory: No.   New problem(s) identified: No, Describe:  none  New Short Term/Long Term Goal(s): medication stabilization, elimination of SI thoughts, development of comprehensive mental wellness plan.   Patient Goals:  Patient unable to identify goals  Discharge Plan or Barriers: Patient recently admitted. CSW will continue to follow and assess for appropriate referrals and possible discharge planning.   Reason for Continuation of Hospitalization: Anxiety Delusions  Hallucinations Medication stabilization  Estimated Length of Stay: 3-5 days  Attendees: Patient: Tracy Bond 08/17/2020   Physician: Myles Lipps, MD 08/17/2020   Nursing:  08/17/2020   RN Care Manager: 08/17/2020   Social Worker: Darletta Moll, LCSW 08/17/2020   Recreational Therapist:  08/17/2020   Other:  08/17/2020   Other:  08/17/2020  Other: 08/17/2020      Scribe for Treatment Team: Vassie Moselle, LCSW 08/17/2020 10:44 AM

## 2020-08-17 NOTE — Progress Notes (Signed)
5 mg of zyprexa po administered at 0317 and 1 mg of ativan po at 0351 without effectiveness for agitation. Pt continues to talk in her room and be disruptive to her roommate. Pt was moved to the quiet room at 0424 and administered 20 mg of IM Geodon at 0434. Nira Conn, NP informed. Pt appears to be falling asleep now. Q 15 min safety checks continue. Pt's safety has been maintained.

## 2020-08-17 NOTE — Progress Notes (Signed)
Pt has been moved back to her room from the quiet room. Pt is currently up making her bed. Pt appears calmer this morning and doesn't appear to be responding to internal stimuli at the moment.

## 2020-08-17 NOTE — Progress Notes (Addendum)
Pt has been resting in bed comfortably now. Her respirations are even and unlabored. No distress has been observed. Q 15 min safety checks continue. Pt's safety has been maintained. Pt was administered her scheduled bedtime medications along with her PRN vistaril and trazodone.

## 2020-08-17 NOTE — Progress Notes (Addendum)
SPIRITUALITY GROUP NOTE  Pt attended spirituality group facilitated by Wilkie Aye, MDIv, BCC.  Group Description:  Group focused on topic of hope.  Patients participated in facilitated discussion around topic, connecting with one another around experiences and definitions for hope.  Group members engaged with visual explorer photos, reflecting on what hope looks like for them today.  Group engaged in discussion around how their definitions of hope are present today in hospital.   Modalities: Psycho-social ed, Adlerian, Narrative, MI  Patient Progress:   Tracy Bond attended group.  She had limited engagement.  Occasionally would raise hand to speak.  When facilitator created space for her, she seemed to exhibit some thought blocking - beginning to speak on subject then pausing.  Appeared to be attending to internal stimuli.   She was able to say "medications" in response to hope.  She also noted her mother, but it was unclear what she was attempting to say about mother.  She presented as labile and was periodically tearful during group.

## 2020-08-17 NOTE — Progress Notes (Signed)
Pt is currently resting in the quiet room with the door open. Her eyes are closed and pt is sleeping. Respirations are even and unlabored. No distress has been observed. Q 15 min safety checks continue. Pt's safety has been maintained.

## 2020-08-17 NOTE — [facility] Suicide Risk Assessment (Signed)
[facility] Admission Suicide Risk Assessment   Nursing information obtained from:  Patient Demographic factors:  Adolescent or young adult, Low socioeconomic status Current Mental Status:  NA Loss Factors:  NA Historical Factors:  NA Risk Reduction Factors:  Living with another person, especially a relative, Positive social support  Total Time spent with patient: 30 minutes Principal Problem: <principal problem not specified> Diagnosis:  Active Problems:   Schizoaffective disorder, bipolar type (HCC)  Subjective Data: Patient is seen and examined.  Patient is a 19 year old female with a past psychiatric history significant for schizophrenia versus schizoaffective disorder as well as an intellectual disability with an IQ estimated at 60 who originally presented to the [facility] emergency department on 08/15/2020 secondary to visual hallucinations and auditory hallucinations.  Apparently the patient had been out of her Abilify for approximately 2 weeks.  Her mother brought her into the hospital.  The patient is unable to really describe why she was noncompliant with medications.  She admitted to auditory and visual hallucinations.  Her mood stability is also in question.  She is very labile.  Apparently the patient is living with her aunt (her mother sister) because of psychosocial issues.  She is not a great historian.  The decision was made to admit her to the hospital for evaluation and stabilization.  Review of the electronic medical record revealed her last psychiatric hospitalization at our facility was on 03/29/2018.  Her discharge medications at that time included Abilify 10 mg p.o. daily, Cogentin 0.5 mg p.o. twice daily, citalopram 20 mg p.o. daily, lorazepam 1 mg p.o. nightly as needed and trazodone 100 mg p.o. nightly as needed.  Her follow-up was to [facility] neuropsychiatric [facility].  On admission she was restarted on her reported medications including amantadine, Abilify, Celexa,  hydroxyzine, trazodone.  The olanzapine agitation protocol was also written for, and unfortunately last evening she required a Geodon injection as well as the Ativan.  She was admitted to the hospital for evaluation and stabilization.  Continued Clinical Symptoms:  Alcohol Use Disorder Identification Test Final Score (AUDIT): 0 The "Alcohol Use Disorders Identification Test", Guidelines for Use in Primary Care, Second Edition.  World Science writer [facility]). Score between 0-7:  no or low risk or alcohol related problems. Score between 8-15:  moderate risk of alcohol related problems. Score between 16-19:  high risk of alcohol related problems. Score 20 or above:  warrants further diagnostic evaluation for alcohol dependence and treatment.   CLINICAL FACTORS:   Schizophrenia:   Command hallucinatons Less than 52 years old Paranoid or undifferentiated type More than one psychiatric diagnosis Previous Psychiatric Diagnoses and Treatments   Musculoskeletal: Strength & Muscle Tone: within normal limits Gait & Station: normal Patient leans: N/A  Psychiatric Specialty Exam: Physical Exam Vitals and nursing note reviewed.  HENT:     Head: Normocephalic and atraumatic.  Pulmonary:     Effort: Pulmonary effort is normal.  Neurological:     General: No focal deficit present.     Mental Status: She is alert.     Review of Systems  Blood pressure (!) 143/82, pulse (!) 116, temperature 99.3 F (37.4 C), temperature source Oral, resp. rate 18, height 5\' 8"  (1.727 m), weight 94.3 kg, SpO2 98 %.Body mass index is 31.63 kg/m.  General Appearance: Disheveled  Eye Contact:  Minimal  Speech:  Pressured  Volume:  Decreased  Mood:  Anxious and Dysphoric  Affect:  Labile  Thought Process:  Disorganized and Descriptions of  Associations: Loose  Orientation:  Negative  Thought Content:  Delusions, Hallucinations: Auditory Visual and Paranoid Ideation  Suicidal Thoughts:  No  Homicidal  Thoughts:  No  Memory:  Immediate;   Poor Recent;   Poor Remote;   Poor  Judgement:  Impaired  Insight:  Lacking  Psychomotor Activity:  Increased  Concentration:  Concentration: Poor and Attention Span: Poor  Recall:  Poor  Fund of Knowledge:  Poor  Language:  Fair  Akathisia:  Negative  Handed:  Right  AIMS (if indicated):     Assets:  Desire for Improvement Resilience Social Support  ADL's:  Impaired  Cognition:  Impaired,  Moderate  Sleep:  Number of Hours: 3.75      COGNITIVE FEATURES THAT CONTRIBUTE TO RISK:  Thought constriction (tunnel vision)    SUICIDE RISK:   Minimal: No identifiable suicidal ideation.  Patients presenting with no risk factors but with morbid ruminations; may be classified as minimal risk based on the severity of the depressive symptoms  PLAN OF CARE: Patient is seen and examined.  Patient is a 19 year old female with the above-stated past psychiatric history who is admitted after worsening auditory and visual hallucinations, paranoid delusions as well as noncompliance with medications.  She will be admitted to the hospital.  She will be integrated in the milieu.  She will be encouraged to attend groups.  Her medications have been restarted, and will have pharmacy make sure about the dosages.  We will contact her mother for collateral information and try and find out why the issues of noncompliance occurred.  Review of her admission laboratories revealed normal electrolytes including liver function enzymes and creatinine.  Her CBC was essentially normal.  Acetaminophen was less than 10, salicylate less than 7.  Blood alcohol was less than 10, salicylate less than 7.  Drug screen was negative.  Beta-hCG was negative.  EKG showed a sinus rhythm with a QTc interval of 458.  Her blood pressure was mildly elevated at 143/82, pulse was 116.  She had a low-grade temperature at 99.3.  We will order urinalysis for that.  She only slept 3.75 hours last night, and  hopefully they will improve when the medications get in her system.  I certify that inpatient services furnished can reasonably be expected to improve the patient's condition.   Antonieta Pert, MD 08/17/2020, 9:17 AM

## 2020-08-17 NOTE — [facility] Group Notes (Signed)
[facility] LCSW Group Therapy  08/17/2020 2:34 PM  Type of Therapy:  Group Therapy  Participation Level:  Did Not Attend  Summary of Progress/Problems: Patient came to group initially, however this patient left group room before group began.   Tracy Bond A Collins Dimaria 08/17/2020, 2:34 PM

## 2020-08-17 NOTE — H&P (Signed)
Psychiatric Admission Assessment Adult  Patient Identification: Tracy Bond MRN:  [id number] Date of Evaluation:  08/17/2020 Chief Complaint:  Schizoaffective disorder, bipolar type (HCC) [F25.0] Principal Diagnosis: <principal problem not specified> Diagnosis:  Active Problems:   Schizoaffective disorder, bipolar type (HCC)  History of Present Illness: Patient is seen and examined.  Patient is a 19 year old female with a past psychiatric history significant for schizophrenia versus schizoaffective disorder as well as an intellectual disability with an IQ estimated at 18 who originally presented to the [facility] emergency department on 08/15/2020 secondary to visual hallucinations and auditory hallucinations.  Apparently the patient had been out of her Abilify for approximately 2 weeks.  Her mother brought her into the hospital.  The patient is unable to really describe why she was noncompliant with medications.  She admitted to auditory and visual hallucinations.  Her mood stability is also in question.  She is very labile.  Apparently the patient is living with her aunt (her mother sister) because of psychosocial issues.  She is not a great historian.  The decision was made to admit her to the hospital for evaluation and stabilization.  Review of the electronic medical record revealed her last psychiatric hospitalization at our facility was on 03/29/2018.  Her discharge medications at that time included Abilify 10 mg p.o. daily, Cogentin 0.5 mg p.o. twice daily, citalopram 20 mg p.o. daily, lorazepam 1 mg p.o. nightly as needed and trazodone 100 mg p.o. nightly as needed.  Her follow-up [facility] be [facility].  On admission she was restarted on her reported medications including amantadine, Abilify, Celexa, hydroxyzine, trazodone.  The olanzapine agitation protocol was also written for, and unfortunately last evening she required a Geodon injection as well as the  Ativan.  She was admitted to the hospital for evaluation and stabilization.  Associated Signs/Symptoms: Depression Symptoms:  depressed mood, anhedonia, insomnia, psychomotor agitation, anxiety, loss of energy/fatigue, disturbed sleep, Duration of Depression Symptoms: Greater than two weeks  (Hypo) Manic Symptoms:  Hallucinations, Impulsivity, Irritable Mood, Labiality of Mood, Anxiety Symptoms:  Excessive Worry, Psychotic Symptoms:  Delusions, Hallucinations: Auditory Paranoia, Duration of Psychotic Symptoms: Greater than six months  PTSD Symptoms: Negative Total Time spent with patient: 30 minutes  Past Psychiatric History: Patient's last admission to our facility was in July 2019.  Diagnosis at that time was schizophrenia, psychosis, schizoaffective disorder, intellectual disability  Is the patient at risk to self? Yes.    Has the patient been a risk to self in the past 6 months? Yes.    Has the patient been a risk to self within the distant past? Yes.    Is the patient a risk to others? Yes.    Has the patient been a risk to others in the past 6 months? Yes.    Has the patient been a risk to others within the distant past? No.   Prior Inpatient Therapy:   Prior Outpatient Therapy:    Alcohol Screening: 1. How often do you have a drink containing alcohol?: Never 2. How many drinks containing alcohol do you have on a typical day when you are drinking?: 1 or 2 3. How often do you have six or more drinks on one occasion?: Never AUDIT-C Score: 0 4. How often during the last year have you found that you were not able to stop drinking once you had started?: Never 5. How often during the last year have you failed to do what was normally expected from  you because of drinking?: Never 6. How often during the last year have you needed a first drink in the morning to get yourself going after a heavy drinking session?: Never 7. How often during the last year have you had a feeling  of guilt of remorse after drinking?: Never 8. How often during the last year have you been unable to remember what happened the night before because you had been drinking?: Never 9. Have you or someone else been injured as a result of your drinking?: No 10. Has a relative or friend or a doctor or another health worker been concerned about your drinking or suggested you cut down?: No Alcohol Use Disorder Identification Test Final Score (AUDIT): 0 Substance Abuse History in the last 12 months:  No. Consequences of Substance Abuse: Negative Previous Psychotropic Medications: Yes  Psychological Evaluations: Yes  Past Medical History:  Past Medical History:  Diagnosis Date   Asthma    Medical history non-contributory    Psychosis (HCC) 03/19/2017   History reviewed. No pertinent surgical history. Family History:  Family History  Problem Relation Age of Onset   Mental illness Neg Hx    Family Psychiatric  History: Reportedly negative Tobacco Screening:   Social History:  Social History   Substance and Sexual Activity  Alcohol Use No   Comment: Pt denied; BAC not available at time of assessment     Social History   Substance and Sexual Activity  Drug Use No   Comment: Pt denied; UDS not available at time of assessment    Additional Social History:                           Allergies:  No Known Allergies Lab Results:  Results for orders placed or performed during the hospital encounter of 08/15/20 (from the past 48 hour(s))  Comprehensive metabolic panel     Status: Abnormal   Collection Time: 08/15/20  7:01 PM  Result Value Ref Range   Sodium 135 135 - 145 mmol/L   Potassium 3.5 3.5 - 5.1 mmol/L   Chloride 106 98 - 111 mmol/L   CO2 21 (L) 22 - 32 mmol/L   Glucose, Bld 98 70 - 99 mg/dL    Comment: Glucose reference range applies only to samples taken after fasting for at least 8 hours.   BUN 10 6 - 20 mg/dL   Creatinine, Ser 1.61 0.44 - 1.00 mg/dL   Calcium  9.2 8.9 - 09.6 mg/dL   Total Protein 8.0 6.5 - 8.1 g/dL   Albumin 4.2 3.5 - 5.0 g/dL   AST 17 15 - 41 U/L   ALT 14 0 - 44 U/L   Alkaline Phosphatase 62 38 - 126 U/L   Total Bilirubin 0.3 0.3 - 1.2 mg/dL   GFR, Estimated >04 >54 mL/min    Comment: (NOTE) Calculated using the CKD-EPI Creatinine Equation (2021)    Anion gap 8 5 - 15    Comment: Performed at [facility], [address], [place], [place] [address]  Ethanol     Status: None   Collection Time: 08/15/20  7:01 PM  Result Value Ref Range   Alcohol, Ethyl (B) <10 <10 mg/dL    Comment: (NOTE) Lowest detectable limit for serum alcohol is 10 mg/dL.  For medical purposes only. Performed at [facility], [address], [place], [place] [address]   Salicylate level     Status: Abnormal  Collection Time: 08/15/20  7:01 PM  Result Value Ref Range   Salicylate Lvl <7.0 (L) 7.0 - 30.0 mg/dL    Comment: Performed at [facility], [address] [place], [place] [address]  Acetaminophen level     Status: Abnormal   Collection Time: 08/15/20  7:01 PM  Result Value Ref Range   Acetaminophen (Tylenol), Serum <10 (L) 10 - 30 ug/mL    Comment: (NOTE) Therapeutic concentrations vary significantly. A range of 10-30 ug/mL  may be an effective concentration for many patients. However, some  are best treated at concentrations outside of this range. Acetaminophen concentrations >150 ug/mL at 4 hours after ingestion  and >50 ug/mL at 12 hours after ingestion are often associated with  toxic reactions.  Performed at [facility], [address], [place], [place] [address]   cbc     Status: Abnormal   Collection Time: 08/15/20  7:01 PM  Result Value Ref Range   WBC 12.2 (H) 4.0 - 10.5 K/uL   RBC 4.34 3.87 - 5.11 MIL/uL   Hemoglobin 12.3 12.0 - 15.0 g/dL   HCT 09.8 36 - 46 %   MCV 88.9 80.0 - 100.0 fL   MCH 28.3 26.0 - 34.0 pg   MCHC 31.9 30.0 - 36.0 g/dL   RDW 11.9 14.7 - 82.9 %   Platelets 260 150 - 400  K/uL   nRBC 0.0 0.0 - 0.2 %    Comment: Performed at [facility], [address] [place], [place] [address]  I-Stat beta hCG blood, ED     Status: None   Collection Time: 08/15/20  8:00 PM  Result Value Ref Range   I-stat hCG, quantitative <5.0 <5 mIU/mL   Comment 3            Comment:   GEST. AGE      CONC.  (mIU/mL)   <=1 WEEK        5 - 50     2 WEEKS       50 - 500     3 WEEKS       100 - 10,000     4 WEEKS     1,000 - 30,000        FEMALE AND NON-PREGNANT FEMALE:     LESS THAN 5 mIU/mL   Respiratory Panel by RT PCR (Flu A&B, Covid) - Nasopharyngeal Swab     Status: None   Collection Time: 08/15/20  8:31 PM   Specimen: Nasopharyngeal Swab  Result Value Ref Range   SARS Coronavirus 2 by RT PCR NEGATIVE NEGATIVE    Comment: (NOTE) SARS-CoV-2 target nucleic acids are NOT DETECTED.  The SARS-CoV-2 RNA is generally detectable in upper respiratoy specimens during the acute phase of infection. The lowest concentration of SARS-CoV-2 viral copies this assay can detect is 131 copies/mL. A negative result does not preclude SARS-Cov-2 infection and should not be used as the sole basis for treatment or other patient management decisions. A negative result may occur with  improper specimen collection/handling, submission of specimen other than nasopharyngeal swab, presence of viral mutation(s) within the areas targeted by this assay, and inadequate number of viral copies (<131 copies/mL). A negative result must be combined with clinical observations, patient history, and epidemiological information. The expected result is Negative.  Fact Sheet for Patients:  https://www.moore.com/  Fact Sheet for Healthcare Providers:  https://www.young.biz/  This test is no t yet approved or cleared by the [place]  [place] FDA and  has been authorized for detection and/or diagnosis of SARS-CoV-2 by FDA under an Emergency Use Authorization (EUA). This EUA  will remain  in effect (meaning this test can be used) for the duration of the COVID-19 declaration under Section 564(b)(1) of the Act, 21 U.S.C. section 360bbb-3(b)(1), unless the authorization is terminated or revoked sooner.     Influenza A by PCR NEGATIVE NEGATIVE   Influenza B by PCR NEGATIVE NEGATIVE    Comment: (NOTE) The Xpert Xpress SARS-CoV-2/FLU/RSV assay is intended as an aid in  the diagnosis of influenza from Nasopharyngeal swab specimens and  should not be used as a sole basis for treatment. Nasal washings and  aspirates are unacceptable for Xpert Xpress SARS-CoV-2/FLU/RSV  testing.  Fact Sheet for Patients: https://www.moore.com/https://www.fda.gov/media/142436/download  Fact Sheet for Healthcare Providers: https://www.young.biz/https://www.fda.gov/media/142435/download  This test is not yet approved or cleared by the [place] FDA and  has been authorized for detection and/or diagnosis of SARS-CoV-2 by  FDA under an Emergency Use Authorization (EUA). This EUA will remain  in effect (meaning this test can be used) for the duration of the  Covid-19 declaration under Section 564(b)(1) of the Act, 21  U.S.C. section 360bbb-3(b)(1), unless the authorization is  terminated or revoked. Performed at [facility], [address] [place], [place] [address]   Rapid urine drug screen (hospital performed)     Status: None   Collection Time: 08/15/20 10:16 PM  Result Value Ref Range   Opiates NONE DETECTED NONE DETECTED   Cocaine NONE DETECTED NONE DETECTED   Benzodiazepines NONE DETECTED NONE DETECTED   Amphetamines NONE DETECTED NONE DETECTED   Tetrahydrocannabinol NONE DETECTED NONE DETECTED   Barbiturates NONE DETECTED NONE DETECTED    Comment: (NOTE) DRUG SCREEN FOR MEDICAL PURPOSES ONLY.  IF CONFIRMATION IS NEEDED FOR ANY PURPOSE, NOTIFY LAB WITHIN 5 DAYS.  LOWEST DETECTABLE LIMITS FOR URINE DRUG SCREEN Drug Class                     Cutoff (ng/mL) Amphetamine and metabolites     1000 Barbiturate and metabolites    200 Benzodiazepine                 200 Tricyclics and metabolites     300 Opiates and metabolites        300 Cocaine and metabolites        300 THC                            50 Performed at [facility], [address] [place], [place] [address]     Blood Alcohol level:  Lab Results  Component Value Date   [facility] 08/15[facility]   ETH <5 03/18/2017    Metabolic Disorder Labs:  Lab Results  Component Value Date   HGBA1C 5.3 03/31/2018   MPG 105.41 03/31/2018   MPG 114 03/24/2017   Lab Results  Component Value Date   PROLACTIN 108.4 (H) 03/24/2017   Lab Results  Component Value Date   CHOL 169 03/31/2018   TRIG 40 03/31/2018   HDL 61 03/31/2018   CHOLHDL 2.8 03/31/2018   VLDL 8 03/31/2018   LDLCALC 100 (H) 03/31/2018   LDLCALC 89 03/24/2017    Current Medications: Current Facility-Administered Medications  Medication Dose Route Frequency Provider Last Rate Last Admin   acetaminophen (TYLENOL) tablet 650 mg  650 mg Oral Q6H PRN Marciano SequinSykes, Janet  E, NP       albuterol (VENTOLIN HFA) 108 (90 Base) MCG/ACT inhaler 1-2 puff  1-2 puff Inhalation Q6H PRN Aldean Baker, NP       alum & mag hydroxide-simeth (MAALOX/MYLANTA) 200-200-20 MG/5ML suspension 30 mL  30 mL Oral Q4H PRN Aldean Baker, NP       amantadine (SYMMETREL) capsule 100 mg  100 mg Oral BID Aldean Baker, NP   100 mg at 08/17/20 0947   ARIPiprazole (ABILIFY) tablet 10 mg  10 mg Oral QHS Aldean Baker, NP   10 mg at 08/16/20 2302   citalopram (CELEXA) tablet 20 mg  20 mg Oral QHS Aldean Baker, NP   20 mg at 08/16/20 2301   hydrOXYzine (ATARAX/VISTARIL) tablet 25 mg  25 mg Oral TID PRN Aldean Baker, NP   25 mg at 08/17/20 1313   loratadine (CLARITIN) tablet 10 mg  10 mg Oral Daily Aldean Baker, NP   10 mg at 08/17/20 0947   LORazepam (ATIVAN) 1 MG tablet            magnesium hydroxide (MILK OF MAGNESIA) suspension 30 mL  30 mL Oral Daily PRN Aldean Baker,  NP       OLANZapine zydis (ZYPREXA) disintegrating tablet 5 mg  5 mg Oral Q8H PRN Aldean Baker, NP   5 mg at 08/17/20 0947   traZODone (DESYREL) tablet 50 mg  50 mg Oral QHS PRN,MR X 1 Aldean Baker, NP   50 mg at 08/16/20 2302   PTA Medications: Medications Prior to Admission  Medication Sig Dispense Refill Last Dose   albuterol (PROVENTIL HFA;VENTOLIN HFA) 108 (90 Base) MCG/ACT inhaler Inhale 2 puffs into the lungs every 4 (four) hours as needed for wheezing or shortness of breath. 1 Inhaler 3    amantadine (SYMMETREL) 100 MG capsule Take 100 mg by mouth at bedtime.       ARIPiprazole (ABILIFY) 10 MG tablet Take 1 tablet (10 mg total) by mouth daily. (Patient taking differently: Take 10 mg by mouth at bedtime. ) 30 tablet 0    cetirizine (ZYRTEC) 10 MG tablet Take 10 mg by mouth daily as needed for allergies.      citalopram (CELEXA) 20 MG tablet Take 1 tablet (20 mg total) by mouth daily. (Patient taking differently: Take 20 mg by mouth at bedtime. ) 30 tablet 0    hydrOXYzine (ATARAX/VISTARIL) 25 MG tablet Take 25 mg by mouth 2 (two) times daily as needed for anxiety.       Musculoskeletal: Strength & Muscle Tone: within normal limits Gait & Station: normal Patient leans: N/A  Psychiatric Specialty Exam: Physical Exam Vitals and nursing note reviewed.  HENT:     Head: Normocephalic and atraumatic.  Pulmonary:     Effort: Pulmonary effort is normal.  Neurological:     General: No focal deficit present.     Mental Status: She is alert.     Review of Systems  Blood pressure (!) 143/82, pulse (!) 116, temperature 99.3 F (37.4 C), temperature source Oral, resp. rate 18, height 5\' 8"  (1.727 m), weight 94.3 kg, SpO2 98 %.Body mass index is 31.63 kg/m.  General Appearance: Disheveled  Eye Contact:  Minimal  Speech:  Pressured  Volume:  Decreased  Mood:  Anxious and Dysphoric  Affect:  Labile  Thought Process:  Disorganized and Descriptions of Associations: Loose   Orientation:  Negative  Thought Content:  Delusions and Hallucinations: Auditory  Suicidal Thoughts:  No  Homicidal Thoughts:  No  Memory:  Immediate;   Poor Recent;   Poor Remote;   Poor  Judgement:  Impaired  Insight:  Lacking  Psychomotor Activity:  Increased  Concentration:  Concentration: Poor  Recall:  Poor  Fund of Knowledge:  Poor  Language:  Fair  Akathisia:  Negative  Handed:  Right  AIMS (if indicated):     Assets:  Desire for Improvement Housing Resilience Social Support  ADL's:  Impaired  Cognition:  Impaired,  Moderate  Sleep:  Number of Hours: 3.75    Treatment Plan Summary: Daily contact with patient to assess and evaluate symptoms and progress in treatment, Medication management and Plan : Patient is seen and examined.  Patient is a 19 year old female with the above-stated past psychiatric history who is admitted after worsening auditory and visual hallucinations, paranoid delusions as well as noncompliance with medications.  She will be admitted to the hospital.  She will be integrated in the milieu.  She will be encouraged to attend groups.  Her medications have been restarted, and will have pharmacy make sure about the dosages.  We will contact her mother for collateral information and try and find out why the issues of noncompliance occurred.  Review of her admission laboratories revealed normal electrolytes including liver function enzymes and creatinine.  Her CBC was essentially normal.  Acetaminophen was less than 10, salicylate less than 7.  Blood alcohol was less than 10, salicylate less than 7.  Drug screen was negative.  Beta-hCG was negative.  EKG showed a sinus rhythm with a QTc interval of 458.  Her blood pressure was mildly elevated at 143/82, pulse was 116.  She had a low-grade temperature at 99.3.  We will order urinalysis for that.  She only slept 3.75 hours last night, and hopefully they will improve when the medications get in her  system.  Observation Level/Precautions:  15 minute checks  Laboratory:  Chemistry Profile  Psychotherapy:    Medications:    Consultations:    Discharge Concerns:    Estimated LOS:  Other:     Physician Treatment Plan for Primary Diagnosis: <principal problem not specified> Long Term Goal(s): Improvement in symptoms so as ready for discharge  Short Term Goals: Ability to identify changes in lifestyle to reduce recurrence of condition will improve, Ability to verbalize feelings will improve, Ability to demonstrate self-control will improve, Ability to identify and develop effective coping behaviors will improve, Ability to maintain clinical measurements within normal limits will improve and Compliance with prescribed medications will improve  Physician Treatment Plan for Secondary Diagnosis: Active Problems:   Schizoaffective disorder, bipolar type (HCC)  Long Term Goal(s): Improvement in symptoms so as ready for discharge  Short Term Goals: Ability to identify changes in lifestyle to reduce recurrence of condition will improve, Ability to verbalize feelings will improve, Ability to demonstrate self-control will improve, Ability to identify and develop effective coping behaviors will improve, Ability to maintain clinical measurements within normal limits will improve and Compliance with prescribed medications will improve  I certify that inpatient services furnished can reasonably be expected to improve the patient's condition.    Antonieta Pert, MD 11/19/20212:15 PM

## 2020-08-17 NOTE — Progress Notes (Signed)
Pt presents with paranoia and endorses AVH.  Pt said "I can't even function, I want a real life."  Pt looked in the mirror in the bathroom and said other people were looking at pt.  RN was able to redirect patient and pt practiced breathing exercises with RN.  Pt will then start whimpering and often it is hard to comprehend what pt is saying.  RN established rapport with pt and assessed for needs/concerns.  Pt took medications without incident.  Pt remains safe with q 15 min checks in place.

## 2020-08-17 NOTE — Progress Notes (Signed)
   08/17/20 2132  Psych Admission Type (Psych Patients Only)  Admission Status Voluntary  Psychosocial Assessment  Patient Complaints Irritability;Agitation  Eye Contact Avoids  Facial Expression Anxious  Affect Anxious  Speech Slow  Interaction Childlike;Forwards little;Guarded;Minimal  Motor Activity Fidgety  Appearance/Hygiene Improved  Behavior Characteristics Cooperative  Mood Depressed;Suspicious;Irritable  Thought Process  Coherency WDL  Content Paranoia;Delusions  Delusions Paranoid  Perception Hallucinations  Hallucination Auditory;Visual  Judgment Limited  Confusion Moderate  Danger to Self  Current suicidal ideation? Denies  Danger to Others  Danger to Others None reported or observed

## 2020-08-17 NOTE — Progress Notes (Signed)
Pt woke up again and has been restless. Pt is having a hard time sleeping because she is psychotic. Pt has been responding to auditory and visual hallucinations which have been making her fearful. She said that she sees things that are coming at her and people that are coming after her. Pt is paranoid, labile, disorganized, and tearful. She has obsessions where she has to wash her face and then she asked me to put a towel on the floor right in front of her toilet. She was given two clean wash cloths earlier, but was still asking this Clinical research associate which one was the clean one. Pt was assisted to the bathroom, was staring off, and looking around the room. Pt is childlike, needy, and needs frequent directions for assistance. She had to be told that she could come off the toilet and then to wash her hands. She will cry and ask for her mother. She is fearful and will tell you not to leave. Pt was administered her PRN 5 mg of zyprexa at 0317 without effectiveness. 1 mg of ativan po administered now.

## 2020-08-18 DIAGNOSIS — F250 Schizoaffective disorder, bipolar type: Secondary | ICD-10-CM | POA: Diagnosis not present

## 2020-08-18 MED ORDER — BUSPIRONE HCL 10 MG PO TABS
10.0000 mg | ORAL_TABLET | Freq: Two times a day (BID) | ORAL | Status: DC
  Administered 2020-08-18 – 2020-08-19 (×3): 10 mg via ORAL
  Filled 2020-08-18 (×7): qty 1

## 2020-08-18 MED ORDER — OLANZAPINE 5 MG PO TBDP
5.0000 mg | ORAL_TABLET | Freq: Every day | ORAL | Status: DC
  Administered 2020-08-18: 5 mg via ORAL
  Filled 2020-08-18 (×4): qty 1

## 2020-08-18 MED ORDER — ARIPIPRAZOLE 15 MG PO TABS
15.0000 mg | ORAL_TABLET | Freq: Every day | ORAL | Status: DC
  Administered 2020-08-18 – 2020-08-20 (×3): 15 mg via ORAL
  Filled 2020-08-18 (×6): qty 1

## 2020-08-18 MED ORDER — OLANZAPINE 5 MG PO TBDP
5.0000 mg | ORAL_TABLET | ORAL | Status: AC
  Administered 2020-08-18: 5 mg via ORAL

## 2020-08-18 NOTE — B[facility] Counselor (Signed)
Clinical Social Work Note  CSW attempted to meet with patient to do her Psychosocial Assessment, but she could not answer the first question, started to cry.  She touched her head and responded affirmatively when asked if her head hurt.  RN was alerted.  CSW will continue attempts to do PSA.  Ambrose Mantle, LCSW 08/18/2020, 11:29 AM

## 2020-08-18 NOTE — Progress Notes (Signed)
[facility] MD Progress Note  08/18/2020 12:57 PM Tracy Bond  MRN:  [id number] Subjective: Patient is a 19 year old female with a past psychiatric history significant for schizophrenia versus schizoaffective disorder as well as intellectual disability with an IQ estimated at 50. She was admitted to the [facility] emergency department on 08/15/2020 secondary to auditory and visual hallucinations.  Objective: Patient is seen and examined. Patient is a 19 year old female with the above-stated past psychiatric history who is seen in follow-up. She is essentially unchanged from yesterday. She remains very anxious. Her speech is garbled, and she is at times quite pitiful. She clearly is overwhelmed by being on the unit. She is at times tearful, guarded and paranoid. Her learning disability limits her ability to adapt to this situation. It was noted in the nursing notes that she seems to still be responding to internal stimuli. We attempted to discuss medication changes with her today. Social work attempted to do a psychosocial assessment, but was unable to complete that. Vital signs have been refused by the patient so far. She still is sleeping very little. She slept 3.25 hours last night. No new laboratories.  Principal Problem: <principal problem not specified> Diagnosis: Active Problems:   Schizoaffective disorder, bipolar type (HCC)  Total Time spent with patient: 20 minutes  Past Psychiatric History: See admission H&P  Past Medical History:  Past Medical History:  Diagnosis Date  . Asthma   . Medical history non-contributory   . Psychosis (HCC) 03/19/2017   History reviewed. No pertinent surgical history. Family History:  Family History  Problem Relation Age of Onset  . Mental illness Neg Hx    Family Psychiatric  History: See admission H&P Social History:  Social History   Substance and Sexual Activity  Alcohol Use No   Comment: Pt denied; BAC not available at time  of assessment     Social History   Substance and Sexual Activity  Drug Use No   Comment: Pt denied; UDS not available at time of assessment    Social History   Socioeconomic History  . Marital status: Single    Spouse name: Not on file  . Number of children: Not on file  . Years of education: Not on file  . Highest education level: Not on file  Occupational History  . Not on file  Tobacco Use  . Smoking status: Never Smoker  . Smokeless tobacco: Never Used  Substance and Sexual Activity  . Alcohol use: No    Comment: Pt denied; BAC not available at time of assessment  . Drug use: No    Comment: Pt denied; UDS not available at time of assessment  . Sexual activity: Never  Other Topics Concern  . Not on file  Social History Narrative  . Not on file   Social Determinants of Health   Financial Resource Strain:   . Difficulty of Paying Living Expenses: Not on file  Food Insecurity:   . Worried About Programme researcher, broadcasting/film/video in the Last Year: Not on file  . Ran Out of Food in the Last Year: Not on file  Transportation Needs:   . Lack of Transportation (Medical): Not on file  . Lack of Transportation (Non-Medical): Not on file  Physical Activity:   . Days of Exercise per Week: Not on file  . Minutes of Exercise per Session: Not on file  Stress:   . Feeling of Stress : Not on file  Social Connections:   . Frequency  of Communication with Friends and Family: Not on file  . Frequency of Social Gatherings with Friends and Family: Not on file  . Attends Religious Services: Not on file  . Active Member of Clubs or Organizations: Not on file  . Attends Banker Meetings: Not on file  . Marital Status: Not on file   Additional Social History:                         Sleep: Poor  Appetite:  Fair  Current Medications: Current Facility-Administered Medications  Medication Dose Route Frequency Provider Last Rate Last Admin  . acetaminophen (TYLENOL)  tablet 650 mg  650 mg Oral Q6H PRN Aldean Baker, NP      . albuterol (VENTOLIN HFA) 108 (90 Base) MCG/ACT inhaler 1-2 puff  1-2 puff Inhalation Q6H PRN Aldean Baker, NP      . alum & mag hydroxide-simeth (MAALOX/MYLANTA) 200-200-20 MG/5ML suspension 30 mL  30 mL Oral Q4H PRN Aldean Baker, NP      . amantadine (SYMMETREL) capsule 100 mg  100 mg Oral BID Aldean Baker, NP   100 mg at 08/18/20 0818  . ARIPiprazole (ABILIFY) tablet 10 mg  10 mg Oral QHS Aldean Baker, NP   10 mg at 08/17/20 2049  . busPIRone (BUSPAR) tablet 10 mg  10 mg Oral BID Antonieta Pert, MD   10 mg at 08/18/20 1154  . citalopram (CELEXA) tablet 20 mg  20 mg Oral QHS Aldean Baker, NP   20 mg at 08/17/20 2049  . hydrOXYzine (ATARAX/VISTARIL) tablet 25 mg  25 mg Oral TID PRN Aldean Baker, NP   25 mg at 08/17/20 1313  . loratadine (CLARITIN) tablet 10 mg  10 mg Oral Daily Aldean Baker, NP   10 mg at 08/18/20 0818  . OLANZapine zydis (ZYPREXA) disintegrating tablet 10 mg  10 mg Oral Q8H PRN Antonieta Pert, MD   10 mg at 08/17/20 2053   And  . LORazepam (ATIVAN) tablet 1 mg  1 mg Oral Q6H PRN Antonieta Pert, MD   1 mg at 08/18/20 0035   And  . ziprasidone (GEODON) injection 20 mg  20 mg Intramuscular Q6H PRN Antonieta Pert, MD      . magnesium hydroxide (MILK OF MAGNESIA) suspension 30 mL  30 mL Oral Daily PRN Aldean Baker, NP      . traZODone (DESYREL) tablet 50 mg  50 mg Oral QHS PRN,MR X 1 Aldean Baker, NP   50 mg at 08/17/20 2049    Lab Results: No results found for this or any previous visit (from the past 48 hour(s)).  Blood Alcohol level:  Lab Results  Component Value Date   ETH <10 08/15/2020   ETH <5 03/18/2017    Metabolic Disorder Labs: Lab Results  Component Value Date   HGBA1C 5.3 03/31/2018   MPG 105.41 03/31/2018   MPG 114 03/24/2017   Lab Results  Component Value Date   PROLACTIN 108.4 (H) 03/24/2017   Lab Results  Component Value Date   CHOL 169 03/31/2018    TRIG 40 03/31/2018   HDL 61 03/31/2018   CHOLHDL 2.8 03/31/2018   VLDL 8 03/31/2018   LDLCALC 100 (H) 03/31/2018   LDLCALC 89 03/24/2017    Physical Findings: AIMS: Facial and Oral Movements Muscles of Facial Expression: None, normal Lips and Perioral Area: None, normal Jaw: None, normal  Tongue: None, normal,Extremity Movements Upper (arms, wrists, hands, fingers): None, normal Lower (legs, knees, ankles, toes): None, normal, Trunk Movements Neck, shoulders, hips: None, normal, Overall Severity Severity of abnormal movements (highest score from questions above): None, normal Incapacitation due to abnormal movements: None, normal Patient's awareness of abnormal movements (rate only patient's report): No Awareness, Dental Status Current problems with teeth and/or dentures?: No Does patient usually wear dentures?: No  CIWA:    COWS:     Musculoskeletal: Strength & Muscle Tone: within normal limits Gait & Station: normal Patient leans: N/A  Psychiatric Specialty Exam: Physical Exam Vitals and nursing note reviewed.  HENT:     Head: Normocephalic and atraumatic.  Pulmonary:     Effort: Pulmonary effort is normal.  Neurological:     General: No focal deficit present.     Mental Status: She is alert.     Review of Systems  Blood pressure (!) 143/82, pulse (!) 116, temperature 99.3 F (37.4 C), temperature source Oral, resp. rate 18, height 5\' 8"  (1.727 m), weight 94.3 kg, SpO2 98 %.Body mass index is 31.63 kg/m.  General Appearance: Disheveled  Eye Contact:  Fair  Speech:  Pressured  Volume:  Decreased  Mood:  Anxious  Affect:  Blunt  Thought Process:  Disorganized and Descriptions of Associations: Loose  Orientation:  Negative  Thought Content:  Illogical, Hallucinations: Auditory and Rumination  Suicidal Thoughts:  No  Homicidal Thoughts:  No  Memory:  Immediate;   Poor Recent;   Poor Remote;   Poor  Judgement:  Impaired  Insight:  Lacking  Psychomotor  Activity:  Increased  Concentration:  Concentration: Poor and Attention Span: Poor  Recall:  Poor  Fund of Knowledge:  Poor  Language:  Fair  Akathisia:  Negative  Handed:  Right  AIMS (if indicated):     Assets:  Desire for Improvement Resilience  ADL's:  Impaired  Cognition:  Impaired,  Moderate  Sleep:  Number of Hours: 3.25     Treatment Plan Summary: Daily contact with patient to assess and evaluate symptoms and progress in treatment, Medication management and Plan : Patient is seen and examined. Patient is a 19 year old female with the above-stated past psychiatric history who is seen in follow-up.   Diagnosis: 1. Schizophrenia. 2. Intellectual deficit with an estimated IQ at 3. 3. Unspecified anxiety  Pertinent findings on examination today: 1. Mental status examination is limited secondary to her current state. She does appear to still be paranoid, and it does appear that she still responding to internal stimuli. 2. Poor sleep 3. Overwhelming anxiety.  Plan: 1. Increase Abilify to 15 mg p.o. nightly for psychosis. 2. Continue amantadine 100 mg p.o. twice daily for side effects of medication. 3. Add BuSpar 10 mg p.o. twice daily for anxiety. 4. Continue citalopram 20 mg p.o. nightly for mood and anxiety. 5. Add olanzapine 5 mg p.o. nightly for psychosis, mood stability and sleep. Hopefully will this will be until the Abilify kicks in. 6. Continue Zyprexa agitation protocol as needed. 7. Continue trazodone 50 mg p.o. nightly as needed insomnia. 8. Disposition planning-in progress.  77, MD 08/18/2020, 12:57 PM

## 2020-08-18 NOTE — Progress Notes (Addendum)
Patient ID: Tracy Bond, female   DOB: 2001-09-19, 19 y.o.   MRN: [id number] Ativan 1mg  PO found in pt's med drawer and returned to the Pyxis. Ativan 1mg  removed from Pyxis, and administered to pt for agitation. Pt previously given Zyprexa Zydis 10mg  for agitation due to +auditory hallucinations of voices as per pt, telling her to kill herself.  Pt provided with active listening, and verbally contracted for safety at 2030, but is currently sitting at the edge of her bed uttering inaudible words and crying. Q15 minute checks continue to be in place for safety. Addendum: Patient was able to sleep at 0200 after being administered Ativan 1 mg PO at 0035.

## 2020-08-18 NOTE — Progress Notes (Signed)
Writer entered patients room and observed her lying in bed awake. Writer introduced self and asked how her day has been. She immediately started pointing at the floor stating you see that on the floor and writer reassured her that there was nothing on the floor. Writer asked if she had taken a shower today because her room smelled strongly of urine. She reported yes but responded in a whining raised voice.  She was offered snack and she refused, when writer asked if she had eaten dinner she reported that no one brought her dinner. Writer opened her tray that was on her bench and half of her food had been eaten and Clinical research associate showed her. Patient appears very child-like and raises her voice at Clinical research associate several times as if she is frustrated with me talking with her. She received her medications, gowns,  fresh towels and bath clothes provided. Safety maintained with 15 min checks

## 2020-08-18 NOTE — Plan of Care (Signed)
Progress note  D: pt found in bed crying. Pt seemed guarded and paranoid. Pt also seemed confused to daily regimen. Pt was oriented with little evidence of learning. Pt continued to worry. Pt eventually came to the medication window with reassurance. Pt was compliant with medication administration with reassurance. Pt seems to be responding to internal stimuli. Pt continues to be isolative to their room. Pt is pleasant though. Pt may not be meeting nutritional needs. Will reassess at lunch. Pt denies si/hi/ah/vh and verbally agrees to approach staff if these become apparent or before harming themselves/others while at [facility]. Pt seems to be responding though.  A: Pt provided support and encouragement. Pt given medication per protocol and standing orders. Q61m safety checks implemented and continued.  R: Pt safe on the unit. Will continue to monitor.  Pt progressing in the following metrics  Problem: Education: Goal: Knowledge of [facility] Education information/materials will improve Outcome: Progressing Goal: Verbalization of understanding the information provided will improve Outcome: Progressing   Problem: Activity: Goal: Sleeping patterns will improve Outcome: Progressing   Problem: Coping: Goal: Ability to demonstrate self-control will improve Outcome: Progressing

## 2020-08-18 NOTE — Progress Notes (Signed)
   08/18/20 2000  COVID-19 Daily Checkoff  Have you had a fever (temp > 37.80C/100F)  in the past 24 hours?  No  If you have had runny nose, nasal congestion, sneezing in the past 24 hours, has it worsened? No  COVID-19 EXPOSURE  Have you traveled outside the state in the past 14 days? No  Have you been in contact with someone with a confirmed diagnosis of COVID-19 or PUI in the past 14 days without wearing appropriate PPE? No  Have you been living in the same home as a person with confirmed diagnosis of COVID-19 or a PUI (household contact)? No  Have you been diagnosed with COVID-19? No

## 2020-08-19 DIAGNOSIS — F250 Schizoaffective disorder, bipolar type: Secondary | ICD-10-CM | POA: Diagnosis not present

## 2020-08-19 MED ORDER — BUSPIRONE HCL 10 MG PO TABS
10.0000 mg | ORAL_TABLET | Freq: Three times a day (TID) | ORAL | Status: DC
  Administered 2020-08-19 – 2020-08-21 (×7): 10 mg via ORAL
  Filled 2020-08-19 (×11): qty 1

## 2020-08-19 MED ORDER — OLANZAPINE 10 MG PO TBDP
10.0000 mg | ORAL_TABLET | Freq: Every day | ORAL | Status: DC
  Administered 2020-08-19 – 2020-08-20 (×2): 10 mg via ORAL
  Filled 2020-08-19 (×5): qty 1

## 2020-08-19 MED ORDER — TRAZODONE HCL 100 MG PO TABS
100.0000 mg | ORAL_TABLET | Freq: Every evening | ORAL | Status: DC | PRN
  Administered 2020-08-19 – 2020-08-20 (×3): 100 mg via ORAL
  Filled 2020-08-19 (×3): qty 1

## 2020-08-19 NOTE — Progress Notes (Signed)
[facility]  08/19/2020 1:39 PM [name]  MRN:  [id number] Subjective:  Patient is a 19 year old female with a past psychiatric history significant for schizophrenia versus schizoaffective disorder as well as intellectual disability with an IQ estimated at 7364. [facility] on 08/15/2020 secondary to auditory and visual hallucinations.  Objective: Patient is seen and examined.  Patient is a 19 year old female with the above-stated past psychiatric history is seen in follow-up.  She is actually a little bit better today.  Last night they had to force her into the shower and clean her up.  Apparently she smelled significantly of urine.  This morning she is less agitated, not moaning or whimpering as much.  She actually carried on a conversation with me this morning.  Her concern is that were going to "send her someplace".  She remains somewhat hyper religious, and did admit to auditory hallucinations.  Her major request to me was that I find someone who can help her "do her hair".  She is mildly tachycardic this morning.  Her heart rate was approximately 109-126.  Her blood pressure was initially elevated this morning at 141/78, and repeat was 133/87.  Her pulse oximetry was 98% on room air.  Her sleep is still poor at 3.25 hours.  No new laboratories.  Principal Problem: <principal problem not specified> Diagnosis: Active Problems:   Schizoaffective disorder, bipolar type (HCC)  Total Time spent with patient: 20 minutes  Past Psychiatric History: See admission H&P  Past Medical History:  Past Medical History:  Diagnosis Date  . Asthma   . Medical history non-contributory   . Psychosis (HCC) 03/19/2017   History reviewed. No pertinent surgical history. Family History:  Family History  Problem Relation Age of Onset  . Mental illness Neg Hx    Family Psychiatric  History: See admission H&P Social History:  Social  History   Substance and Sexual Activity  Alcohol Use No   Comment: Pt denied; BAC not available at time of assessment     Social History   Substance and Sexual Activity  Drug Use No   Comment: Pt denied; UDS not available at time of assessment    Social History   Socioeconomic History  . Marital status: Single    Spouse name: Not on file  . Number of children: Not on file  . Years of education: Not on file  . Highest education level: Not on file  Occupational History  . Not on file  Tobacco Use  . Smoking status: Never Smoker  . Smokeless tobacco: Never Used  Substance and Sexual Activity  . Alcohol use: No    Comment: Pt denied; BAC not available at time of assessment  . Drug use: No    Comment: Pt denied; UDS not available at time of assessment  . Sexual activity: Never  Other Topics Concern  . Not on file  Social History Narrative  . Not on file   Social Determinants of Health   Financial Resource Strain:   . Difficulty of Paying Living Expenses: Not on file  Food Insecurity:   . Worried About Programme researcher, broadcasting/film/videounning Out of Food in the Last Year: Not on file  . Ran Out of Food in the Last Year: Not on file  Transportation Needs:   . Lack of Transportation (Medical): Not on file  . Lack of Transportation (Non-Medical): Not on file  Physical Activity:   . Days of Exercise  per Week: Not on file  . Minutes of Exercise per Session: Not on file  Stress:   . Feeling of Stress : Not on file  Social Connections:   . Frequency of Communication with Friends and Family: Not on file  . Frequency of Social Gatherings with Friends and Family: Not on file  . Attends Religious Services: Not on file  . Active Member of Clubs or Organizations: Not on file  . Attends Banker Meetings: Not on file  . Marital Status: Not on file   Additional Social History:                         Sleep: Poor  Appetite:  Fair  Current Medications: Current Facility-Administered  Medications  Medication Dose Route Frequency Provider Last Rate Last Admin  . acetaminophen (TYLENOL) tablet 650 mg  650 mg Oral Q6H PRN Aldean Baker, NP      . albuterol (VENTOLIN HFA) 108 (90 Base) MCG/ACT inhaler 1-2 puff  1-2 puff Inhalation Q6H PRN Aldean Baker, NP      . alum & mag hydroxide-simeth (MAALOX/MYLANTA) 200-200-20 MG/5ML suspension 30 mL  30 mL Oral Q4H PRN Aldean Baker, NP      . amantadine (SYMMETREL) capsule 100 mg  100 mg Oral BID Aldean Baker, NP   100 mg at 08/19/20 0933  . ARIPiprazole (ABILIFY) tablet 15 mg  15 mg Oral QHS Antonieta Pert, MD   15 mg at 08/18/20 2010  . busPIRone (BUSPAR) tablet 10 mg  10 mg Oral BID Antonieta Pert, MD   10 mg at 08/19/20 0933  . citalopram (CELEXA) tablet 20 mg  20 mg Oral QHS Aldean Baker, NP   20 mg at 08/18/20 2011  . hydrOXYzine (ATARAX/VISTARIL) tablet 25 mg  25 mg Oral TID PRN Aldean Baker, NP   25 mg at 08/18/20 2014  . loratadine (CLARITIN) tablet 10 mg  10 mg Oral Daily Aldean Baker, NP   10 mg at 08/19/20 0933  . OLANZapine zydis (ZYPREXA) disintegrating tablet 10 mg  10 mg Oral Q8H PRN Antonieta Pert, MD   10 mg at 08/18/20 1336   And  . LORazepam (ATIVAN) tablet 1 mg  1 mg Oral Q6H PRN Antonieta Pert, MD   1 mg at 08/18/20 1336   And  . ziprasidone (GEODON) injection 20 mg  20 mg Intramuscular Q6H PRN Antonieta Pert, MD      . magnesium hydroxide (MILK OF MAGNESIA) suspension 30 mL  30 mL Oral Daily PRN Aldean Baker, NP      . OLANZapine zydis (ZYPREXA) disintegrating tablet 5 mg  5 mg Oral QHS Antonieta Pert, MD   5 mg at 08/18/20 2011  . traZODone (DESYREL) tablet 50 mg  50 mg Oral QHS PRN,MR X 1 Aldean Baker, NP   50 mg at 08/18/20 2011    Lab Results: No results found for this or any previous visit (from the past 48 hour(s)).  Blood Alcohol level:  Lab Results  Component Value Date   [facility] <10 08/15/2020   ETH <5 03/18/2017    Metabolic Disorder Labs: Lab Results   Component Value Date   HGBA1C 5.3 03/31/2018   MPG 105.41 03/31/2018   MPG 114 03/24/2017   Lab Results  Component Value Date   PROLACTIN 108.4 (H) 03/24/2017   Lab Results  Component Value Date  CHOL 169 03/31/2018   TRIG 40 03/31/2018   HDL 61 03/31/2018   CHOLHDL 2.8 03/31/2018   VLDL 8 03/31/2018   LDLCALC 100 (H) 03/31/2018   LDLCALC 89 03/24/2017    Physical Findings: AIMS: Facial and Oral Movements Muscles of Facial Expression: None, normal Lips and Perioral Area: None, normal Jaw: None, normal Tongue: None, normal,Extremity Movements Upper (arms, wrists, hands, fingers): None, normal Lower (legs, knees, ankles, toes): None, normal, Trunk Movements Neck, shoulders, hips: None, normal, Overall Severity Severity of abnormal movements (highest score from questions above): None, normal Incapacitation due to abnormal movements: None, normal Patient's awareness of abnormal movements (rate only patient's report): No Awareness, Dental Status Current problems with teeth and/or dentures?: No Does patient usually wear dentures?: No  CIWA:    COWS:     Musculoskeletal: Strength & Muscle Tone: within normal limits Gait & Station: normal Patient leans: N/A  Psychiatric Specialty Exam: Physical Exam Vitals and nursing note reviewed.  Constitutional:      Appearance: Normal appearance.  HENT:     Head: Normocephalic and atraumatic.  Pulmonary:     Effort: Pulmonary effort is normal.  Neurological:     General: No focal deficit present.     Mental Status: She is alert.     Review of Systems  Blood pressure 133/87, pulse (!) 126, temperature 97.7 F (36.5 C), temperature source Oral, resp. rate 18, height 5\' 8"  (1.727 m), weight 94.3 kg, SpO2 98 %.Body mass index is 31.63 kg/m.  General Appearance: Casual  Eye Contact:  Fair  Speech:  Normal Rate  Volume:  Decreased  Mood:  Anxious and Dysphoric  Affect:  Congruent  Thought Process:  Goal Directed and  Descriptions of Associations: Loose  Orientation:  Full (Time, Place, and Person)  Thought Content:  Delusions, Hallucinations: Auditory, Paranoid Ideation and Rumination  Suicidal Thoughts:  No  Homicidal Thoughts:  No  Memory:  Immediate;   Poor Recent;   Poor Remote;   Poor  Judgement:  Impaired  Insight:  Lacking  Psychomotor Activity:  Increased  Concentration:  Concentration: Fair and Attention Span: Fair  Recall:  of Knowledge:  Fair  Language:  Fair  Akathisia:  Negative  Handed:  Right  AIMS (if indicated):     Assets:  Desire for Improvement Housing Resilience  ADL's:  Impaired  Cognition:  Impaired,  Moderate  Sleep:  Number of Hours: 3.25     Treatment Plan Summary: Daily contact with patient to assess and evaluate symptoms and progress in treatment, Medication management and Plan : Patient is seen and examined.  Patient is a 19 year old female with the above-stated past psychiatric history who is seen in follow-up.  Diagnosis: 1. Schizophrenia. 2. Intellectual deficit with an estimated IQ at 34. 3. Unspecified anxiety  Pertinent findings on examination today: 1.  Less anxious and agitated today. 2.  Speech rate has decreased and is more understandable. 3.  Continued to admit to auditory hallucinations. 4.  Poor sleep continues.  Plan: 1.  Continue amantadine 100 mg p.o. twice daily for side effects of medications. 2.  Continue Abilify 15 mg p.o. nightly for psychosis and mood stability. 3.  Increase BuSpar to 10 mg p.o. 3 times daily for anxiety. 4.  Continue Celexa 20 mg p.o. daily for depression and anxiety. 5.  Continue hydroxyzine 25 mg p.o. 3 times daily as needed anxiety. 6.  Stop loratadine in case it is contributing to insomnia problems. 7.  Continue olanzapine  agitation protocol as needed. 8.  Increase trazodone to 100 mg p.o. nightly as needed insomnia. 9.  Increase olanzapine Zydis to 10 mg p.o. nightly for psychosis and mood  stability. 10.  Disposition planning-in progress. Antonieta Pert, MD 08/19/2020, 1:39 PM

## 2020-08-19 NOTE — [facility] Group Notes (Signed)
Adult Psychoeducational Group Note  Date:  08/19/2020 Time:  10:11 PM  Group Topic/Focus:  Wrap-Up Group:   The focus of this group is to help patients review their daily goal of treatment and discuss progress on daily workbooks.  Participation Level:  Active  Participation Quality:  Appropriate and Attentive  Affect:  Appropriate  Cognitive:  Alert and Appropriate  Insight: Appropriate and Good  Engagement in Group:  Engaged  Modes of Intervention:  Discussion and Education  Additional Comments:  Pt attended and participated in wrap up group this evening. Pt stated that today they felt tearful when they hear voices in their head. Pt told Clinical research associate that they are not hearing those voices as of now. Pt completed their goal, which was to attend groups.   Chrisandra Netters 08/19/2020, 10:11 PM

## 2020-08-19 NOTE — [facility] Group Notes (Signed)
Adult Psychoeducational Group Not Date:  08/19/2020 Time:  0900-1045 Group Topic/Focus: PROGRESSIVE RELAXATION. A group where deep breathing is taught and tensing and relaxation muscle groups is used. Imagery is used as well.  Pts are asked to imagine 3 pillars that hold them up when they are not able to hold themselves up.  Participation Level:  Active  Participation Quality:  appropriate  Affect:  Appropriate  Cognitive:  Oriented  Insight: showing some insight  Engagement in Group:  Engaged  Modes of Intervention:  Activity, Discussion, Education, and Support  Additional Comments:  Pt rates her energy at a 1/10. Did have insights that she shared with the group stating, "I know that the things I am seeing aren't real, but it still scares me. States that she has experience this before. States what holds her up is God, herself and her inner strength. Had her hand on her chest and appeared to be frightened. Was able to remove her hand from her chest and relax.  Dione Housekeeper 08/19/2020`

## 2020-08-19 NOTE — Plan of Care (Signed)
Nurse discussed anxiety, depression and coping skills with patient.  

## 2020-08-19 NOTE — Progress Notes (Signed)
Patient has been awake for some time this morning. Since patient was awake Clinical research associate and female MHT encouraged and assisted patient to the shower to take care of her hygiene. She was resistant reporting that she was tired. Writer informed her that her bed linen will need to be changed while in the shower. She  c/o the water running too fast and when asked if she was bathing self she yelled stop being so mean to me. Bed linens changed and patient given clothes brought in by her mother to put on. She needs constant direction and redirection. Writer informed patient that she needs to not yell at staff when she is frustrated because we are here to help her. She eventually finished shower, dressed and came to dayroom for vitals to be taken. Will continue to monitor her progress.

## 2020-08-19 NOTE — [facility] Suicide Risk Assessment (Signed)
[facility] INPATIENT:  Family/Significant Other Suicide Prevention Education  Suicide Prevention Education:  Education Completed; Mother/Legal Guardian [name] [phone],  (name of family member/significant other) has been identified by the patient as the family member/significant other with whom the patient will be residing, and identified as the person(s) who will aid the patient in the event of a mental health crisis (suicidal ideations/suicide attempt).  With written consent from the patient, the family member/significant other has been provided the following suicide prevention education, prior to the and/or following the discharge of the patient.  The suicide prevention education provided includes the following:  Suicide risk factors  Suicide prevention and interventions  National Suicide Hotline telephone number  [facility] assessment telephone number  [facility] Emergency Assistance 911  [facility] and/or Residential Mobile Crisis Unit telephone number  Request made of family/significant other to:  Remove weapons (e.g., guns, rifles, knives), all items previously/currently identified as safety concern.    Remove drugs/medications (over-the-counter, prescriptions, illicit drugs), all items previously/currently identified as a safety concern.  The family member/significant other verbalizes understanding of the suicide prevention education information provided.  The family member/significant other agrees to remove the items of safety concern listed above.  Carloyn Jaeger Grossman-Orr 08/19/2020, 2:31 PM

## 2020-08-19 NOTE — Progress Notes (Signed)
   08/19/20 2000  Psych Admission Type (Psych Patients Only)  Admission Status Voluntary  Psychosocial Assessment  Patient Complaints Anxiety  Eye Contact Avoids;Brief  Facial Expression Anxious;Pensive;Sullen;Sad;Trembling lip;Worried  Affect Anxious;Labile;Preoccupied  Speech Other (Comment) (whining, raises voice occassionally)  Interaction Avoidant;Cautious;Childlike;Forwards little;Guarded;Minimal  Motor Activity Fidgety;Slow  Appearance/Hygiene Unremarkable  Behavior Characteristics Appropriate to situation;Anxious;Fidgety  Mood Anxious;Suspicious;Preoccupied  Thought Administrator, sports thinking  Content Obsessions;Paranoia  Delusions Paranoid  Perception Hallucinations  Hallucination Auditory;Visual  Judgment Limited  Confusion Moderate  Danger to Self  Current suicidal ideation? Denies  Danger to Others  Danger to Others None reported or observed

## 2020-08-19 NOTE — Progress Notes (Signed)
D:  Patient sat on her bed this morning, crying, confused.  This afternoon patient was sitting on her bed reading her Bible.  Mother brought her glasses today so she could read.  Patient was not as confused this afternoon as she was this morning. Patient combed her hair, showered, put on clean clothes. A:  Medications administered per MD orders.  Patient was reluctant to take her meds this morning, but did not hesitate this afternoon to take her meds. A:  Safety maintained with 15 minute checks.

## 2020-08-19 NOTE — Progress Notes (Signed)
   08/19/20 2000  COVID-19 Daily Checkoff  Have you had a fever (temp > 37.80C/100F)  in the past 24 hours?  No  If you have had runny nose, nasal congestion, sneezing in the past 24 hours, has it worsened? No  COVID-19 EXPOSURE  Have you traveled outside the state in the past 14 days? No  Have you been in contact with someone with a confirmed diagnosis of COVID-19 or PUI in the past 14 days without wearing appropriate PPE? No  Have you been living in the same home as a person with confirmed diagnosis of COVID-19 or a PUI (household contact)? No  Have you been diagnosed with COVID-19? No
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
## 2020-08-19 NOTE — [facility] Group Notes (Signed)
[facility] LCSW Group Therapy Note  Date/Time:  08/19/2020 11:00 AM-11:56 AM  Type of Therapy and Topic:  Group Therapy:  Feelings About Hospitalization  Participation Level:  Active   Description of Group This process group involved patients discussing their feelings related to being hospitalized, as well as the benefits they see to being in the hospital.  These feelings and benefits were itemized.  The group then brainstormed specific ways in which they could seek those same benefits when they discharge and return home.  Therapeutic Goals 1. Patient will identify and describe positive and negative feelings related to hospitalization 2. Patient will verbalize benefits of hospitalization to themselves personally 3. Patients will brainstorm together ways they can obtain similar benefits in the outpatient setting, identify barriers to wellness and possible solutions 4. Patients will commit to one change upon discharge.   Summary of Patient Progress:  Pt engaged but minimally. Pt shared feeling safe and good here. Pt reports thank you God I woke up this morning. Pt reports her commitment is to go to church, not commit suicide and read her bible.   Therapeutic Modalities Cognitive Behavioral Therapy Motivational Interviewing   Raeanne Gathers, LCSW 08/19/2020, 12:27 PM

## 2020-08-20 NOTE — [facility] Group Notes (Signed)
The focus of this group is to help patients establish daily goals to achieve during treatment and discuss how the patient can incorporate goal setting into their daily lives to aide in recovery.  Pt says her goal is to get out. Pt has no insight into group

## 2020-08-20 NOTE — [facility] Group Notes (Signed)
LCSW Group Therapy Note   08/20/2020  Type of Therapy and Topic: Group Therapy: Feelings Around Returning Home & Establishing a Supportive Framework and Supporting Oneself When Supports Not Available  Participation Level: Did Not Attend   Description of Group: Patients first processed thoughts and feelings about upcoming discharge. These included fears of upcoming changes, lack of change, new living environments, judgements and expectations from others and overall stigma of mental health issues. The group then discussed the definition of a supportive framework, what that looks and feels like, and how do to discern it from an unhealthy non-supportive network. The group identified different types of supports as well as what to do when your family/friends are less than helpful or unavailable    Therapeutic Goals  1.  Patient will identify one healthy supportive network that they can use at discharge.  2.  Patient will identify one factor of a supportive framework and how to tell it from an unhealthy network.  3.  Patient able to identify one coping skill to use when they do not have positive supports from others.  4.  Patient will demonstrate ability to communicate their needs through discussion and/or role plays.  Summary of Patient Progress: Patient was invited to attend group, however chose not to attend.   Therapeutic Modalities Cognitive Behavioral Therapy Motivational Interviewing  Croix Presley MSW, LCSW Clincal Social Worker  [facility]  

## 2020-08-20 NOTE — Progress Notes (Signed)
Patient is awake lying in her bed yelling "help me Im scared" over and over again even after she is reassured by staff.

## 2020-08-20 NOTE — Progress Notes (Addendum)
Patient has been laying in the bed awake. Each time a check has been done she is has been awake asking either the time or wanting to go home. She told the MHT that she needed to go to the bathroom and was instructed to do so. Writer took her 2nd trazodone and an ativan to her room. Writer noticed that patient had layed in her bed and wet herself and her bed without even getting up to go to the bathroom. When writer asked her why she did it she started crying. Writer informed her that she needed to get up and clean herself up and change her bed linens. She got up and then changed her mind yelling she was tired and she was going to lay back down. Writer had her to get up and wash herself up and strip her bed down. She started cursing at Clinical research associate asking why she was being mean. Writer assisted her to put on an adult diaper in case she has anymore accidents. Will continue to monitor.Writer observed that when the patient doesn't get to do what she wants she gets very angry and will curse and yell. Will monitor effectiveness of medicines given because patient did not sleep well the previous night.

## 2020-08-20 NOTE — Progress Notes (Signed)
Patient up this morning and is mad at staff. She will not answer writer when asked if she rested at all last night. She rolled her eyes and kept her hand up to the side of her face so she didn't look at Clinical research associate. Patient is labile, childlike and has behavioral issues. Patient seems to be at baseline. She has not had any incidents of of visual hallucinations this shift.

## 2020-08-20 NOTE — Progress Notes (Signed)
[facility] MD Progress Note  08/20/2020 2:12 PM Tracy Bond  MRN:  [id number] Subjective:  Patient is a 19 year old female with a past psychiatric history significant for schizophrenia versus schizoaffective disorder as well as intellectual disability with an IQ estimated at 41. She was admitted to the [facility] emergency department on 08/15/2020 secondary to auditory and visual hallucinations.  Objective: Patient is seen and examined.  Patient is a 19 year old female with the above-stated past psychiatric history is seen in follow-up.  Patient had an episode of bed wetting yesterday, but was able to clean herself and the bed up when asked to do so, although noted to be yelling and angry when asked to do so. Patient noted to be childlike, and likely near baseline by staff.  Patient interviewed in her room this morning, she states that her mood Is "ok" although indicates that she had a bad night although declined to discuss. Patient was filling out a self assessment form and asked if she was doing it correctly. Assured patient that there were no right or wrong answers and to check the box of whatever she was feeling regarding the question asked. Patient appeared to be relieved. She denied SI/HI/AVH. She states that she feels that she is back to her normal self. No other complaints or concerns.    Principal Problem: <principal problem not specified> Diagnosis: Active Problems:   Schizoaffective disorder, bipolar type (HCC)  Total Time spent with patient: 20 minutes  Past Psychiatric History: See admission H&P  Past Medical History:  Past Medical History:  Diagnosis Date  . Asthma   . Medical history non-contributory   . Psychosis (HCC) 03/19/2017   History reviewed. No pertinent surgical history. Family History:  Family History  Problem Relation Age of Onset  . Mental illness Neg Hx    Family Psychiatric  History: See admission H&P Social History:  Social History    Substance and Sexual Activity  Alcohol Use No   Comment: Pt denied; BAC not available at time of assessment     Social History   Substance and Sexual Activity  Drug Use No   Comment: Pt denied; UDS not available at time of assessment    Social History   Socioeconomic History  . Marital status: Single    Spouse name: Not on file  . Number of children: Not on file  . Years of education: Not on file  . Highest education level: Not on file  Occupational History  . Not on file  Tobacco Use  . Smoking status: Never Smoker  . Smokeless tobacco: Never Used  Substance and Sexual Activity  . Alcohol use: No    Comment: Pt denied; BAC not available at time of assessment  . Drug use: No    Comment: Pt denied; UDS not available at time of assessment  . Sexual activity: Never  Other Topics Concern  . Not on file  Social History Narrative  . Not on file   Social Determinants of Health   Financial Resource Strain:   . Difficulty of Paying Living Expenses: Not on file  Food Insecurity:   . Worried About Programme researcher, broadcasting/film/video in the Last Year: Not on file  . Ran Out of Food in the Last Year: Not on file  Transportation Needs:   . Lack of Transportation (Medical): Not on file  . Lack of Transportation (Non-Medical): Not on file  Physical Activity:   . Days of Exercise per Week: Not on file  .  Minutes of Exercise per Session: Not on file  Stress:   . Feeling of Stress : Not on file  Social Connections:   . Frequency of Communication with Friends and Family: Not on file  . Frequency of Social Gatherings with Friends and Family: Not on file  . Attends Religious Services: Not on file  . Active Member of Clubs or Organizations: Not on file  . Attends BankerClub or Organization Meetings: Not on file  . Marital Status: Not on file   Additional Social History:                         Sleep: Fair  Appetite:  Fair  Current Medications: Current Facility-Administered  Medications  Medication Dose Route Frequency Provider Last Rate Last Admin  . acetaminophen (TYLENOL) tablet 650 mg  650 mg Oral Q6H PRN Aldean BakerSykes, Janet E, NP   650 mg at 08/20/20 1340  . albuterol (VENTOLIN HFA) 108 (90 Base) MCG/ACT inhaler 1-2 puff  1-2 puff Inhalation Q6H PRN Aldean BakerSykes, Janet E, NP      . alum & mag hydroxide-simeth (MAALOX/MYLANTA) 200-200-20 MG/5ML suspension 30 mL  30 mL Oral Q4H PRN Aldean BakerSykes, Janet E, NP      . amantadine (SYMMETREL) capsule 100 mg  100 mg Oral BID Aldean BakerSykes, Janet E, NP   100 mg at 08/20/20 0748  . ARIPiprazole (ABILIFY) tablet 15 mg  15 mg Oral QHS Antonieta Pertlary, Greg Lawson, MD   15 mg at 08/19/20 2140  . busPIRone (BUSPAR) tablet 10 mg  10 mg Oral TID Antonieta Pertlary, Greg Lawson, MD   10 mg at 08/20/20 1220  . citalopram (CELEXA) tablet 20 mg  20 mg Oral QHS Aldean BakerSykes, Janet E, NP   20 mg at 08/19/20 2140  . hydrOXYzine (ATARAX/VISTARIL) tablet 25 mg  25 mg Oral TID PRN Aldean BakerSykes, Janet E, NP   25 mg at 08/18/20 2014  . loratadine (CLARITIN) tablet 10 mg  10 mg Oral Daily Aldean BakerSykes, Janet E, NP   10 mg at 08/20/20 0748  . OLANZapine zydis (ZYPREXA) disintegrating tablet 10 mg  10 mg Oral Q8H PRN Antonieta Pertlary, Greg Lawson, MD   10 mg at 08/18/20 1336   And  . LORazepam (ATIVAN) tablet 1 mg  1 mg Oral Q6H PRN Antonieta Pertlary, Greg Lawson, MD   1 mg at 08/19/20 2351   And  . ziprasidone (GEODON) injection 20 mg  20 mg Intramuscular Q6H PRN Antonieta Pertlary, Greg Lawson, MD      . magnesium hydroxide (MILK OF MAGNESIA) suspension 30 mL  30 mL Oral Daily PRN Aldean BakerSykes, Janet E, NP      . OLANZapine zydis (ZYPREXA) disintegrating tablet 10 mg  10 mg Oral QHS Antonieta Pertlary, Greg Lawson, MD   10 mg at 08/19/20 2140  . traZODone (DESYREL) tablet 100 mg  100 mg Oral QHS PRN,MR X 1 Antonieta Pertlary, Greg Lawson, MD   100 mg at 08/19/20 2351    Lab Results: No results found for this or any previous visit (from the past 48 hour(s)).  Blood Alcohol level:  Lab Results  Component Value Date   [facility]   ETH <5 03/18/2017    Metabolic  Disorder Labs: Lab Results  Component Value Date   HGBA1C 5.3 03/31/2018   MPG 105.41 03/31/2018   MPG 114 03/24/2017   Lab Results  Component Value Date   PROLACTIN 108.4 (H) 03/24/2017   Lab Results  Component Value Date   CHOL 169 03/31/2018  TRIG 40 03/31/2018   HDL 61 03/31/2018   CHOLHDL 2.8 03/31/2018   VLDL 8 03/31/2018   LDLCALC 100 (H) 03/31/2018   LDLCALC 89 03/24/2017    Physical Findings: AIMS: Facial and Oral Movements Muscles of Facial Expression: None, normal Lips and Perioral Area: None, normal Jaw: None, normal Tongue: None, normal,Extremity Movements Upper (arms, wrists, hands, fingers): None, normal Lower (legs, knees, ankles, toes): None, normal, Trunk Movements Neck, shoulders, hips: None, normal, Overall Severity Severity of abnormal movements (highest score from questions above): None, normal Incapacitation due to abnormal movements: None, normal Patient's awareness of abnormal movements (rate only patient's report): No Awareness, Dental Status Current problems with teeth and/or dentures?: No Does patient usually wear dentures?: No  CIWA:    COWS:     Musculoskeletal: Strength & Muscle Tone: within normal limits Gait & Station: normal Patient leans: N/A  Psychiatric Specialty Exam: Physical Exam Vitals and nursing note reviewed.  Constitutional:      Appearance: Normal appearance.  HENT:     Head: Normocephalic and atraumatic.  Pulmonary:     Effort: Pulmonary effort is normal.  Neurological:     General: No focal deficit present.     Mental Status: She is alert.     Review of Systems   Blood pressure 125/80, pulse (!) 104, temperature 98 F (36.7 C), temperature source Oral, resp. rate 18, height 5\' 8"  (1.727 m), weight 94.3 kg, SpO2 98 %.Body mass index is 31.63 kg/m.  General Appearance: Casual  Eye Contact:  Fair  Speech:  Normal Rate  Volume:  Decreased  Mood:  Anxious and Dysphoric  Affect:  Congruent  Thought  Process:  Goal Directed and Descriptions of Associations: Loose  Orientation:  Full (Time, Place, and Person)  Thought Content:  Delusions, Hallucinations: Auditory, Paranoid Ideation and Rumination  Suicidal Thoughts:  No  Homicidal Thoughts:  No  Memory:  Immediate;   Poor Recent;   Poor Remote;   Poor  Judgement:  Impaired  Insight:  Lacking  Psychomotor Activity:  Increased  Concentration:  Concentration: Fair and Attention Span: Fair  Recall:  of Knowledge:  Fair  Language:  Fair  Akathisia:  Negative  Handed:  Right  AIMS (if indicated):     Assets:  Desire for Improvement Housing Resilience  ADL's:  Impaired  Cognition:  Impaired,  Moderate  Sleep:  Number of Hours: 1     Treatment Plan Summary: Daily contact with patient to assess and evaluate symptoms and progress in treatment, Medication management and Plan : Patient is seen and examined.  Patient is a 19 year old female with the above-stated past psychiatric history who is seen in follow-up.  Diagnosis: 1. Schizophrenia. 2. Intellectual deficit with an estimated IQ at 44. 3. Unspecified anxiety  Pertinent findings on examination today: 1.  Less anxious and agitated today. 2.  Speech rate has decreased and is more understandable. 3.  Denied AVH 4.  Seems that poor sleep continues; episode of bed wetting and declined to answer any questions about sleep today  Plan: 1.  Continue amantadine 100 mg p.o. twice daily for side effects of medications. 2.  Continue Abilify 15 mg p.o. nightly for psychosis and mood stability. 3.  Increase BuSpar to 10 mg p.o. 3 times daily for anxiety. 4.  Continue Celexa 20 mg p.o. daily for depression and anxiety. 5.  Continue hydroxyzine 25 mg p.o. 3 times daily as needed anxiety. 6.  Stop loratadine in case it is contributing  to insomnia problems. 7.  Continue olanzapine agitation protocol as needed. 8.  Increase trazodone to 100 mg p.o. nightly as needed insomnia. 9.   Increase olanzapine Zydis to 10 mg p.o. nightly for psychosis and mood stability. 10.  Disposition planning-in progress- suspect soon Estella Husk, MD 08/20/2020, 2:12 PM

## 2020-08-20 NOTE — Progress Notes (Signed)
Recreation Therapy Notes  INPATIENT RECREATION THERAPY ASSESSMENT  Patient Details Name: Tracy Bond MRN: [id number] DOB: 04-08-2001 Today's Date: 08/20/2020       Information Obtained From: Patient  Able to Participate in Assessment/Interview: Yes  Patient Presentation: Alert (Paranoid)  Reason for Admission (Per Patient): Other (Comments) (Pt stated she was experiencing headaches)  Patient Stressors:  (None)  Coping Skills:   Journal, Sports, TV, Arguments, Music, Exercise, Meditate, Talk, Prayer  Leisure Interests (2+):  Music - Listen, Music - Other (Comment), Individual - Other (Comment), Community - Other (Comment) (Write in planner; Go to church; Dance)  Frequency of Recreation/Participation: Other (Comment) (Daily)  Awareness of Community Resources:  Yes  Community Resources:  [employer]  Current Use: Yes  If no, Barriers?:    Expressed Interest in [employer] Information: No  [employer] of Residence:  [place]  Patient Main Form of Transportation: Set designer  Patient Strengths:  Confident; Intelligent; Beautiful  Patient Identified Areas of Improvement:  Anxiety  Patient Goal for Hospitalization:  "be the best I can be on earth and smile more"  Current SI (including self-harm):  No  Current HI:  No  Current AVH: No  Staff Intervention Plan: Group Attendance, Collaborate with Interdisciplinary Treatment Team  Consent to Intern Participation: N/A    Caroll Rancher, LRT/CTRS  Caroll Rancher A 08/20/2020, 1:38 PM

## 2020-08-20 NOTE — Progress Notes (Signed)
Recreation Therapy Notes  Date: 11.22.21 Time: 1005 Location: [address] Group Room  Group Topic: Triggers  Goal Area(s) Addresses:  Patient will identify three things that trigger them.  Patient will identify how to avoid triggers.  Patient will identify how triggers can be dealt with head on.  Behavioral Response: Attentive  Intervention: Worksheet, Discussion  Activity:  Triggers.  Patients were to identify the things that trigger a reaction out of them.  Patients would also identify ways to avoid trigger.  Patients were to also identify ways to deal with triggers when they can't be avoided.  Education: Anger Management, Discharge Planning   Education Outcome: Acknowledges education/In group clarification offered/Needs additional education.   Clinical Observations/Feedback: Pt showed some confusion during group session.  Pt identified one trigger as "the enemy".  Pt expressed avoiding the trigger by "staying in the word".  Pt identified listening to family and not being disrespectful as ways to avoid dealing with triggers.    Caroll Rancher, LRT/CTRS         Caroll Rancher A 08/20/2020 12:50 PM

## 2020-08-21 MED ORDER — CITALOPRAM HYDROBROMIDE 20 MG PO TABS: 20.0000 mg | ORAL_TABLET | Freq: Every day | ORAL | 0 refills | Status: DC

## 2020-08-21 MED ORDER — ARIPIPRAZOLE 15 MG PO TABS: 15.0000 mg | ORAL_TABLET | Freq: Every day | ORAL | 0 refills | Status: DC

## 2020-08-21 MED ORDER — BUSPIRONE HCL 10 MG PO TABS: 10.0000 mg | ORAL_TABLET | Freq: Three times a day (TID) | ORAL | 0 refills | Status: AC

## 2020-08-21 MED ORDER — TRAZODONE HCL 100 MG PO TABS: 100.0000 mg | ORAL_TABLET | Freq: Every evening | ORAL | 0 refills | Status: DC | PRN

## 2020-08-21 MED ORDER — HYDROXYZINE HCL 25 MG PO TABS: 25.0000 mg | ORAL_TABLET | Freq: Three times a day (TID) | ORAL | 0 refills | Status: DC | PRN

## 2020-08-21 MED ORDER — OLANZAPINE 10 MG PO TBDP: 10.0000 mg | ORAL_TABLET | Freq: Every day | ORAL | 0 refills | Status: DC

## 2020-08-21 MED ORDER — AMANTADINE HCL 100 MG PO CAPS: 100.0000 mg | ORAL_CAPSULE | Freq: Two times a day (BID) | ORAL | 0 refills | Status: AC

## 2020-08-21 NOTE — Plan of Care (Signed)
Discharge note  Patient verbalizes readiness for discharge. Follow up plan explained, AVS, Transition record and SRA given. Prescriptions and teaching provided. Belongings returned and signed for. Suicide safety plan completed and signed. Patient verbalizes understanding. Patient denies SI/HI and assures this Clinical research associate they will seek assistance should that change. Patient discharged to lobby where mother was waiting. Mother expressed concerns that they felt the pt wasn't ready for discharge and wanted to speak to a provider. Providers that saw the pt had left and providers who were here suggested pt be taken to [facility] for readmittance if appropriate. This action seconded by [facility]. Pt's mother left with pt stating they would "pray on it".   Problem: Education: Goal: Knowledge of [place] [facility] Education information/materials will improve Outcome: Adequate for Discharge Goal: Emotional status will improve Outcome: Adequate for Discharge Goal: Mental status will improve Outcome: Adequate for Discharge Goal: Verbalization of understanding the information provided will improve Outcome: Adequate for Discharge   Problem: Activity: Goal: Interest or engagement in activities will improve Outcome: Adequate for Discharge Goal: Sleeping patterns will improve Outcome: Adequate for Discharge   Problem: Coping: Goal: Ability to verbalize frustrations and anger appropriately will improve Outcome: Adequate for Discharge Goal: Ability to demonstrate self-control will improve Outcome: Adequate for Discharge   Problem: Health Behavior/Discharge Planning: Goal: Identification of resources available to assist in meeting health care needs will improve Outcome: Adequate for Discharge Goal: Compliance with treatment plan for underlying cause of condition will improve Outcome: Adequate for Discharge   Problem: Physical Regulation: Goal: Ability to maintain clinical measurements within normal limits will  improve Outcome: Adequate for Discharge   Problem: Safety: Goal: Periods of time without injury will increase Outcome: Adequate for Discharge   Problem: Activity: Goal: Will verbalize the importance of balancing activity with adequate rest periods Outcome: Adequate for Discharge   Problem: Education: Goal: Will be free of psychotic symptoms Outcome: Adequate for Discharge Goal: Knowledge of the prescribed therapeutic regimen will improve Outcome: Adequate for Discharge   Problem: Coping: Goal: Coping ability will improve Outcome: Adequate for Discharge Goal: Will verbalize feelings Outcome: Adequate for Discharge   Problem: Health Behavior/Discharge Planning: Goal: Compliance with prescribed medication regimen will improve Outcome: Adequate for Discharge   Problem: Nutritional: Goal: Ability to achieve adequate nutritional intake will improve Outcome: Adequate for Discharge   Problem: Role Relationship: Goal: Ability to communicate needs accurately will improve Outcome: Adequate for Discharge Goal: Ability to interact with others will improve Outcome: Adequate for Discharge   Problem: Safety: Goal: Ability to redirect hostility and anger into socially appropriate behaviors will improve Outcome: Adequate for Discharge Goal: Ability to remain free from injury will improve Outcome: Adequate for Discharge   Problem: Self-Care: Goal: Ability to participate in self-care as condition permits will improve Outcome: Adequate for Discharge   Problem: Self-Concept: Goal: Will verbalize positive feelings about self Outcome: Adequate for Discharge

## 2020-08-21 NOTE — Discharge Summary (Signed)
Physician Discharge Summary Note  Patient:  Tracy Bond is an 19 y.o., female MRN:  [id number] DOB:  June 19, 2001 Patient phone:  [phone] (home)  Patient address:   [address] [place] [address] [place] [id number],  Total Time spent with patient: 30 minutes  Date of Admission:  08/16/2020 Date of Discharge: 08/21/2020  Reason for Admission: Tracy Bond is a 19 yo female, with a reported IQ of 7664,  who [facility] by her mother, having an episode of psychosis. Pt reported that she is seeing multiple things flying at her, thinks that there are things crawling on her, and she is currently hearing voices. The voices are NOT telling her to do things to herself or others. Pt endorses that there were some issues with her prescription for abilify and pt has not been taking it for two+ weeks. Pts mother reported occasional depression, but not something that pt has most of the time. Pt denied any SI or HI at time of assessment. Pts mother reported that there was a recent change in living situation--pt and mother were displaced and are now living with pts aunt (mother's sister).  Principal Problem: Schizoaffective disorder, bipolar type [facility](HCC) Discharge Diagnoses: Principal Problem:   Schizoaffective disorder, bipolar type [facility](HCC)   Past Psychiatric History: See H&P  Past Medical History:  Past Medical History:  Diagnosis Date  . Asthma   . Medical history non-contributory   . Psychosis (HCC) 03/19/2017   History reviewed. No pertinent surgical history. Family History:  Family History  Problem Relation Age of Onset  . Mental illness Neg Hx    Family Psychiatric  History: See H&P Social History:  Social History   Substance and Sexual Activity  Alcohol Use No   Comment: Pt denied; BAC not available at time of assessment     Social History   Substance and Sexual Activity  Drug Use No   Comment: Pt denied; UDS not available at time of  assessment    Social History   Socioeconomic History  . Marital status: Single    Spouse name: Not on file  . Number of children: Not on file  . Years of education: Not on file  . Highest education level: Not on file  Occupational History  . Not on file  Tobacco Use  . Smoking status: Never Smoker  . Smokeless tobacco: Never Used  Substance and Sexual Activity  . Alcohol use: No    Comment: Pt denied; BAC not available at time of assessment  . Drug use: No    Comment: Pt denied; UDS not available at time of assessment  . Sexual activity: Never  Other Topics Concern  . Not on file  Social History Narrative  . Not on file   Social Determinants of Health   Financial Resource Strain:   . Difficulty of Paying Living Expenses: Not on file  Food Insecurity:   . Worried About Programme researcher, broadcasting/film/videounning Out of Food in the Last Year: Not on file  . Ran Out of Food in the Last Year: Not on file  Transportation Needs:   . Lack of Transportation (Medical): Not on file  . Lack of Transportation (Non-Medical): Not on file  Physical Activity:   . Days of Exercise per Week: Not on file  . Minutes of Exercise per Session: Not on file  Stress:   . Feeling of Stress : Not on file  Social Connections:   . Frequency of Communication  with Friends and Family: Not on file  . Frequency of Social Gatherings with Friends and Family: Not on file  . Attends Religious Services: Not on file  . Active Member of Clubs or Organizations: Not on file  . Attends Banker Meetings: Not on file  . Marital Status: Not on file    Hospital Course: She remained on the [facility] unit for 4 days. She was started on She participated in group therapy on the unit. She responded well to treatment with no adverse effects reported. She has shown improved mood, affect, sleep, and interaction. She denies any SI/HI/AVH and contracts for safety. She is discharging on the medications listed below. She agrees to follow [facility] @11 :45. Patient is provided with prescriptions for medications upon discharge. Patient is being picked up by her mother for discharge to home.    Physical Findings: AIMS: Facial and Oral Movements Muscles of Facial Expression: None, normal Lips and Perioral Area: None, normal Jaw: None, normal Tongue: None, normal,Extremity Movements Upper (arms, wrists, hands, fingers): None, normal Lower (legs, knees, ankles, toes): None, normal, Trunk Movements Neck, shoulders, hips: None, normal, Overall Severity Severity of abnormal movements (highest score from questions above): None, normal Incapacitation due to abnormal movements: None, normal Patient's awareness of abnormal movements (rate only patient's report): No Awareness, Dental Status Current problems with teeth and/or dentures?: No Does patient usually wear dentures?: No  CIWA:    COWS:     Musculoskeletal: Strength & Muscle Tone: within normal limits Gait & Station: normal Patient leans: N/A  Psychiatric Specialty Exam: Physical Exam  Review of Systems  Constitutional: Negative for activity change and appetite change.  Respiratory: Negative for chest tightness and shortness of breath.   Cardiovascular: Negative for chest pain.  Gastrointestinal: Negative for abdominal pain.  Neurological: Negative for facial asymmetry and headaches.   Blood pressure (!) 141/86, pulse (!) 116, temperature 98 F (36.7 C), temperature source Oral, resp. rate 20, height 5\' 8"  (1.727 m), weight 94.3 kg, SpO2 98 %.Body mass index is 31.63 kg/m.  General Appearance: Casual and Fairly Groomed  Eye Contact:  Fair  Speech:  Clear and Coherent and Normal Rate  Volume:  Normal  Mood:  "better"  Affect:  Constricted  Thought Process:  Linear  Orientation:  Full (Time, Place, and Person)  Thought Content:  WDL  Suicidal Thoughts:  No  Homicidal Thoughts:  No  Memory:  Immediate;   Fair Recent;   Fair Remote;    Fair  Judgement:  Fair  Insight:  Fair  Psychomotor Activity:  Normal  Concentration:  Concentration: Fair and Attention Span: Fair  Recall:  of Knowledge:  Fair  Language:  Fair  Akathisia:  Negative  Handed:  Right  AIMS (if indicated):     Assets:  Communication Skills Desire for Improvement Financial Resources/Insurance Housing Physical Health Resilience Social Support  ADL's:  Intact  Cognition:  Impaired,  Moderate  Sleep:  Number of Hours: 4     Have you used any form of tobacco in the last 30 days? (Cigarettes, Smokeless Tobacco, Cigars, and/or Pipes): No  Has this patient used any form of tobacco in the last 30 days? (Cigarettes, Smokeless Tobacco, Cigars, and/or Pipes) Yes, No  Blood Alcohol level:  Lab Results  Component Value Date   [facility] <10 08/15/2020   ETH <5 03/18/2017    Metabolic Disorder Labs:  Lab Results  Component Value Date  HGBA1C 5.3 03/31/2018   MPG 105.41 03/31/2018   MPG 114 03/24/2017   Lab Results  Component Value Date   PROLACTIN 108.4 (H) 03/24/2017   Lab Results  Component Value Date   CHOL 169 03/31/2018   TRIG 40 03/31/2018   HDL 61 03/31/2018   CHOLHDL 2.8 03/31/2018   VLDL 8 03/31/2018   LDLCALC 100 (H) 03/31/2018   LDLCALC 89 03/24/2017    See Psychiatric Specialty Exam and Suicide Risk Assessment completed by Attending Physician prior to discharge.  Discharge destination:  Home  Is patient on multiple antipsychotic therapies at discharge:  No   Has Patient had three or more failed trials of antipsychotic monotherapy by history:  No  Recommended Plan for Multiple Antipsychotic Therapies: NA  Discharge Instructions    Diet - low sodium heart healthy   Complete by: As directed    Increase activity slowly   Complete by: As directed      Allergies as of 08/21/2020   No Known Allergies     Medication List    STOP taking these medications   ARIPiprazole 10 MG tablet Commonly known as:  ABILIFY Replaced by: ARIPiprazole 15 MG tablet     TAKE these medications     Indication  albuterol 108 (90 Base) MCG/ACT inhaler Commonly known as: VENTOLIN HFA Inhale 2 puffs into the lungs every 4 (four) hours as needed for wheezing or shortness of breath.  Indication: Asthma   amantadine 100 MG capsule Commonly known as: SYMMETREL Take 1 capsule (100 mg total) by mouth 2 (two) times daily. What changed: when to take this  Indication: Extrapyramidal Reaction caused by Medications   ARIPiprazole 15 MG tablet Commonly known as: ABILIFY Take 1 tablet (15 mg total) by mouth at bedtime. Replaces: ARIPiprazole 10 MG tablet  Indication: Major Depressive Disorder   busPIRone 10 MG tablet Commonly known as: BUSPAR Take 1 tablet (10 mg total) by mouth 3 (three) times daily.  Indication: Anxiety Disorder   cetirizine 10 MG tablet Commonly known as: ZYRTEC Take 10 mg by mouth daily as needed for allergies.  Indication: Hayfever   citalopram 20 MG tablet Commonly known as: CELEXA Take 1 tablet (20 mg total) by mouth at bedtime.  Indication: Depression   hydrOXYzine 25 MG tablet Commonly known as: ATARAX/VISTARIL Take 1 tablet (25 mg total) by mouth 3 (three) times daily as needed for anxiety. What changed: when to take this  Indication: Feeling Anxious   OLANZapine zydis 10 MG disintegrating tablet Commonly known as: ZYPREXA Take 1 tablet (10 mg total) by mouth at bedtime.  Indication: Depressive Phase of Manic-Depression   traZODone 100 MG tablet Commonly known as: DESYREL Take 1 tablet (100 mg total) by mouth at bedtime as needed and may repeat dose one time if needed for sleep.  Indication: Trouble Sleeping       Follow-up Information    [facility] Follow up on 08/28/2020.   Why: You have an appointment on 08/28/20 at 11:45 am for medication management services.  This will be a Virtual appointment.  Contact information: [address] [place] [address] [phone]        [facility] Go on 08/27/2020.   Specialty: Psychology Why: You  have an appointment to re-established care for therapy services, on 08/27/20 at 3:00 pm.  This appointment will be held in person.  **Please call to give your Email address in order to confirm appointment and receive forms.  Contact information: [address] [place] [address] [phone]               Follow-up recommendations:  Activity:  as tolerated Diet:  Heart healthy  Comments:  Patient is instructed prior to discharge to:  Take all medications as prescribed by his/her mental healthcare provider. Report any adverse effects and or reactions from the medicines to his/her outpatient provider promptly. Patient has been instructed & cautioned: To not engage in alcohol and or illegal drug use while on prescription medicines. In the event of worsening symptoms, patient is instructed to call the crisis hotline, 911 and or go to the nearest ED for appropriate evaluation and treatment of symptoms. To follow-up with his/her primary care provider for your other medical issues, concerns and or health care needs.   Signed: Laveda Abbe, NP 08/21/2020, 11:16 AM

## 2020-08-21 NOTE — Plan of Care (Signed)
Pt had a hard time focusing and was easily distracted in recreation therapy group sessions.     Caroll Rancher, LRT/CTRS

## 2020-08-21 NOTE — Progress Notes (Signed)
°  [facility] Adult Case Management Discharge Plan :  Will you be returning to the same living situation after discharge:  Yes,  to home At discharge, do you have transportation home?: Yes,  mother to pick this patient up Do you have the ability to pay for your medications: Yes,  has insurance  Release of information consent forms completed and in the chart;  Patient's signature needed at discharge.  Patient to Follow up at:  Follow-up Information    [facility] Follow up on 08/28/2020.   Why: You have an appointment on 08/28/20 at 11:45 am for medication management services.  This will be a Virtual appointment.  Contact information: [address] [place] [address] [phone]        [facility] Go on 08/27/2020.   Specialty: Psychology Why: You  have an appointment to re-established care for therapy services, on 08/27/20 at 3:00 pm.  This appointment will be held in person.  **Please call to give your Email address in order to confirm appointment and receive forms.  Contact information: [address] [place] [address] [phone]               Next level of care provider has access to [facility] Link:no  Safety Planning and Suicide Prevention discussed: Yes,  with mother/legal guardian  Have you used any form of tobacco in the last 30 days? (Cigarettes, Smokeless Tobacco, Cigars, and/or Pipes): No  Has patient been referred to the Quitline?: N/A patient is not a smoker  Patient has been referred for addiction treatment: N/A  Otelia Santee, LCSW 08/21/2020, 9:28 AM

## 2020-08-21 NOTE — Progress Notes (Signed)
Recreation Therapy Notes  INPATIENT RECREATION TR PLAN  Patient Details Name: [name] MRN: [id number] DOB: 2001-09-03 Today's Date: 08/21/2020  Rec Therapy Plan Is patient appropriate for Therapeutic Recreation?: Yes Treatment times per week: about 3 days Estimated Length of Stay: 5-7 days TR Treatment/Interventions: Group participation (Comment)  Discharge Criteria Pt will be discharged from therapy if:: Discharged Treatment plan/goals/alternatives discussed and agreed upon by:: Patient/family  Discharge Summary Short term goals set: See patient care plan Short term goals met: Not met Progress toward goals comments: Groups attended Which groups?: Other (Comment) (Support Systems; Triggers) Reason goals not met: Pt unable to focus. Therapeutic equipment acquired: N/A Reason patient discharged from therapy: Other (Comment) Pt/family agrees with progress & goals achieved: Yes Date patient discharged from therapy: 08/21/20    Victorino Sparrow, LRT/CTRS  Ria Comment, Leeandra Ellerson A 08/21/2020, 1:09 PM

## 2020-08-21 NOTE — [facility] Suicide Risk Assessment (Signed)
[facility] Discharge Suicide Risk Assessment   Principal Problem: Schizoaffective disorder, bipolar type [facility]) Discharge Diagnoses: Principal Problem:   Schizoaffective disorder, bipolar type (HCC)   Total Time spent with patient: 20 minutes  Musculoskeletal: Strength & Muscle Tone: within normal limits Gait & Station: normal Patient leans: N/A  Psychiatric Specialty Exam: Review of Systems  Constitutional: Negative for activity change and appetite change.  Respiratory: Negative for chest tightness and shortness of breath.   Cardiovascular: Negative for chest pain.  Gastrointestinal: Negative for abdominal pain.  Neurological: Negative for facial asymmetry and headaches.    Blood pressure (!) 141/86, pulse (!) 116, temperature 98 F (36.7 C), temperature source Oral, resp. rate 20, height 5\' 8"  (1.727 m), weight 94.3 kg, SpO2 98 %.Body mass index is 31.63 kg/m.  General Appearance: Casual, Fairly Groomed and laying in bed in NAD  Eye Contact::  Fair  Speech:  Clear and Coherent and Normal Rate409  Volume:  Decreased  Mood:  "better"  Affect:  Constricted and euthymic  Thought Process:  Linear  Orientation:  Full (Time, Place, and Person)  Thought Content:  WDL  Suicidal Thoughts:  No  Homicidal Thoughts:  No  Memory:  Immediate;   Fair Recent;   Fair  Judgement:  Fair  Insight:  Fair  Psychomotor Activity:  Normal  Concentration:  Fair  Recall:  [phone] of Knowledge:Fair  Language: Good  Akathisia:  No  Handed:  Right  AIMS (if indicated):     Assets:  Communication Skills Desire for Improvement Housing Physical Health Social Support  Sleep:  Number of Hours: 4  Cognition: WNL  ADL's:  Intact   Mental Status Per Nursing Assessment::   On Admission:  NA  Demographic Factors:  Adolescent or young adult and Low socioeconomic status  Loss Factors: NA  Historical Factors: NA  Risk Reduction Factors:   Living with another person, especially a relative and  Positive social support  Continued Clinical Symptoms:  Previous Psychiatric Diagnoses and Treatments  Cognitive Features That Contribute To Risk:  Thought constriction (tunnel vision)    Suicide Risk:  Minimal: No identifiable suicidal ideation.  Patients presenting with no risk factors but with morbid ruminations; may be classified as minimal risk based on the severity of the depressive symptoms   Follow-up Information    [facility] Follow up on 08/28/2020.   Why: You have an appointment on 08/28/20 at 11:45 am for medication management services.  This will be a Virtual appointment.  Contact information: [address] [place] [phone]        [facility] Go on 08/27/2020.   Specialty: Psychology Why: You  have an appointment to re-established care for therapy services, on 08/27/20 at 3:00 pm.  This appointment will be held in person.  **Please call to give your Email address in order to confirm appointment and receive forms.  Contact information: [address] [place] [phone]               Plan Of Care/Follow-up recommendations:  Activity:  as tolerated Diet:  regular Other:      Patient is instructed prior to discharge to: Take all medications as prescribed by his/her mental healthcare provider. Report any adverse effects and or reactions from the medicines to his/her outpatient provider promptly. Patient has been instructed & cautioned: To not engage in alcohol and or illegal drug use while on prescription medicines. In the event of  worsening symptoms, patient is instructed to call the crisis hotline, 911 and or go to the nearest ED for appropriate evaluation and treatment of symptoms. To follow-up with his/her primary care provider for your other medical issues, concerns and or health care needs.     Estella Husk, MD 08/21/2020, 10:29 AM

## 2020-08-21 NOTE — Progress Notes (Signed)
Recreation Therapy Notes  Date: 11.23.21 Time: 1000 Location: [address] Dayroom   Group Topic: Support System   Goal Area(s) Addresses:  Patient will identify members of their support system. Patient will verbalize benefit of healthy supports. Patient will verbalize positive effect of healthy supports post d/c.  Patient will identify negative relationships in support system and discuss alternatives.    Behavioral Response: None   Intervention: Holiday representative paper, colored pencils   Activity: Support System Mapping.  LRT led art activity in getting patients to identify members of their support system good and bad outside of the hospital.  Patients were to then map out proximity of the people in their support system in relation to themselves.   Education: Heritage manager, Special educational needs teacher, Discharge Planning   Education Outcome: Acknowledges understanding/In group clarification offered/Needs additional education.   Clinical Observations/Feedback: Pt did not respond during group.  Pt seemed distracted and responding to things not there.    Meryle Ready, LRT/CTRS         Caroll Rancher A 08/21/2020 11:55 AM

## 2020-08-21 NOTE — Progress Notes (Signed)
Adult Psychoeducational Group Note  Date:  08/21/2020 Time:  5:08 AM  Group Topic/Focus:  Wrap-Up Group:   The focus of this group is to help patients review their daily goal of treatment and discuss progress on daily workbooks.  Participation Level:  Minimal  Participation Quality:  Appropriate  Affect:  Anxious and Irritable  Cognitive:  Disorganized and Confused  Insight: Limited  Engagement in Group:  Lacking and Limited  Modes of Intervention:  Discussion  Additional Comments:  Pt stated her goal for today was to focus on her treatment plan and attend all groups.  Pt stated she accomplished her goals today. Pt stated her relationship with her family has improved since she was admitted. Pt stated contacting her mother today improve her day. Pt stated she felt better about herself today. Pt stated she took all her medicine from her providers today. Pt stated she was able to talk with her doctor and her social worker about her care today. Pt rated her overall day a 10. Pt stated her appetite was pretty good today and she attend all received all meals today. Pt stated her sleep last night was poor.  Pt stated the goal for tonight is to get some rest. Pt nurse was made aware of situation. Pt stated she was in some physical pain today.  Pt stated her left leg was in some pain and she had a headache. Pt rated her pain level a 3. Pt nurse was made aware of situation. Pt deny visual hallucinations. Pt admitted to experiencing some auditory issues today. Writer asked pt when was the last time this happen. Pt stated about 5 mins ago. Pt nurse was made aware of situation. Pt denies thoughts of harming herself or others. Pt stated she would alert staff if anything changes.  Felipa Furnace 08/21/2020, 5:08 AM

## 2021-03-22 ENCOUNTER — Emergency Department (HOSPITAL_COMMUNITY): Payer: Medicaid Other

## 2021-03-22 ENCOUNTER — Emergency Department (HOSPITAL_COMMUNITY)
Admission: EM | Admit: 2021-03-22 | Discharge: 2021-03-22 | Disposition: A | Discharge status: Home / Self Care | Payer: Medicaid Other | Attending: Emergency Medicine | Admitting: Emergency Medicine

## 2021-03-22 DIAGNOSIS — R000 Tachycardia, unspecified: Secondary | ICD-10-CM | POA: Diagnosis not present

## 2021-03-22 DIAGNOSIS — R0602 Shortness of breath: Secondary | ICD-10-CM | POA: Insufficient documentation

## 2021-03-22 DIAGNOSIS — J45909 Unspecified asthma, uncomplicated: Secondary | ICD-10-CM | POA: Insufficient documentation

## 2021-03-22 DIAGNOSIS — Z20822 Contact with and (suspected) exposure to covid-19: Secondary | ICD-10-CM | POA: Diagnosis not present

## 2021-03-22 DIAGNOSIS — R600 Localized edema: Secondary | ICD-10-CM | POA: Diagnosis not present

## 2021-03-22 LAB — COMPREHENSIVE METABOLIC PANEL
ALT: 21 U/L (ref 0–44)
AST: 21 U/L (ref 15–41)
Albumin: 3.4 g/dL — ABNORMAL LOW (ref 3.5–5.0)
Alkaline Phosphatase: 57 U/L (ref 38–126)
Anion gap: 8 (ref 5–15)
BUN: 9 mg/dL (ref 6–20)
CO2: 23 mmol/L (ref 22–32)
Calcium: 8.9 mg/dL (ref 8.9–10.3)
Chloride: 106 mmol/L (ref 98–111)
Creatinine, Ser: 0.83 mg/dL (ref 0.44–1.00)
GFR, Estimated: >60 mL/min (ref >60)
Glucose, Bld: 130 mg/dL — ABNORMAL HIGH (ref 70–99)
Potassium: 3.9 mmol/L (ref 3.5–5.1)
Sodium: 137 mmol/L (ref 135–145)
Total Bilirubin: 0.2 mg/dL — ABNORMAL LOW (ref 0.3–1.2)
Total Protein: 6.8 g/dL (ref 6.5–8.1)

## 2021-03-22 LAB — CBC WITH DIFFERENTIAL/PLATELET
Abs Immature Granulocytes: 0.03 10*3/uL (ref 0.00–0.07)
Basophils Absolute: 0 10*3/uL (ref 0.0–0.1)
Basophils Relative: 0 %
Eosinophils Absolute: 0.1 10*3/uL (ref 0.0–0.5)
Eosinophils Relative: 2 %
HCT: 39.8 % (ref 36.0–46.0)
Hemoglobin: 13 g/dL (ref 12.0–15.0)
Immature Granulocytes: 0 %
Lymphocytes Relative: 23 %
Lymphs Abs: 2.1 10*3/uL (ref 0.7–4.0)
MCH: 28.6 pg (ref 26.0–34.0)
MCHC: 32.7 g/dL (ref 30.0–36.0)
MCV: 87.7 fL (ref 80.0–100.0)
Monocytes Absolute: 0.4 10*3/uL (ref 0.1–1.0)
Monocytes Relative: 5 %
Neutro Abs: 6.2 10*3/uL (ref 1.7–7.7)
Neutrophils Relative %: 70 %
Platelets: 224 10*3/uL (ref 150–400)
RBC: 4.54 MIL/uL (ref 3.87–5.11)
RDW: 13.4 % (ref 11.5–15.5)
WBC: 8.9 10*3/uL (ref 4.0–10.5)
nRBC: 0 % (ref 0.0–0.2)

## 2021-03-22 LAB — D-DIMER, QUANTITATIVE: D-Dimer, Quant: 0.62 ug/mL-FEU — ABNORMAL HIGH (ref 0.00–0.50)

## 2021-03-22 LAB — I-STAT BETA HCG BLOOD, ED (MC, WL, AP ONLY): I-stat hCG, quantitative: <5 m[IU]/mL (ref <5)

## 2021-03-22 LAB — RESP PANEL BY RT-PCR (FLU A&B, COVID) ARPGX2
Influenza A by PCR: NEGATIVE
Influenza B by PCR: NEGATIVE
SARS Coronavirus 2 by RT PCR: NEGATIVE

## 2021-03-22 LAB — TROPONIN I (HIGH SENSITIVITY): Troponin I (High Sensitivity): 3 ng/L (ref <18)

## 2021-03-22 MED ORDER — IOHEXOL 350 MG/ML SOLN
75.0000 mL | Freq: Once | INTRAVENOUS | Status: AC | PRN
  Administered 2021-03-22: 75 mL via INTRAVENOUS

## 2021-03-22 NOTE — ED Notes (Signed)
Pt to CT

## 2021-03-22 NOTE — ED Provider Notes (Signed)
[facility] EMERGENCY DEPARTMENT Provider Note   CSN: [id number] Arrival date & time: 03/22/21  1725     History Chief Complaint  Patient presents with   Shortness of Breath   Leg Swelling    Tracy Bond is a 20 y.o. female.  The history is provided by the patient and a caregiver.  Shortness of Breath Severity:  Mild Onset quality:  Gradual Duration:  1 week Timing:  Intermittent Progression:  Waxing and waning Chronicity:  New Context: activity   Relieved by:  Nothing Worsened by:  Nothing Associated symptoms: no abdominal pain, no chest pain, no claudication, no cough, no diaphoresis, no ear pain, no fever, no headaches, no hemoptysis, no neck pain, no PND, no rash, no sore throat, no sputum production, no syncope, no swollen glands, no vomiting and no wheezing   Risk factors: no hx of PE/DVT       Past Medical History:  Diagnosis Date   Asthma    Medical history non-contributory    Psychosis (HCC) 03/19/2017    Patient Active Problem List   Diagnosis Date Noted   Aggressive behavior 03/30/2018   Schizophrenia (HCC) 03/29/2018   Psychosis (HCC) 03/19/2017   Schizoaffective disorder, bipolar type (HCC) 03/18/2017   Acute anxiety 03/16/2017    No past surgical history on file.   OB History     Gravida  0   Para  0   Term  0   Preterm  0   AB  0   Living  0      SAB  0   IAB  0   Ectopic  0   Multiple  0   Live Births  0           Family History  Problem Relation Age of Onset   Mental illness Neg Hx     Social History   Tobacco Use   Smoking status: Never   Smokeless tobacco: Never  Substance Use Topics   Alcohol use: No    Comment: Pt denied; BAC not available at time of assessment   Drug use: No    Comment: Pt denied; UDS not available at time of assessment    Home Medications Prior to Admission medications   Medication Sig Start Date End Date Taking? Authorizing Provider  albuterol  (PROVENTIL HFA;VENTOLIN HFA) 108 (90 Base) MCG/ACT inhaler Inhale 2 puffs into the lungs every 4 (four) hours as needed for wheezing or shortness of breath. 04/08/17   Muthersbaugh, Dahlia Client, PA-C  amantadine (SYMMETREL) 100 MG capsule Take 1 capsule (100 mg total) by mouth 2 (two) times daily. 08/21/20   Laveda Abbe, NP  ARIPiprazole (ABILIFY) 15 MG tablet Take 1 tablet (15 mg total) by mouth at bedtime. 08/21/20   Laveda Abbe, NP  busPIRone (BUSPAR) 10 MG tablet Take 1 tablet (10 mg total) by mouth 3 (three) times daily. 08/21/20   Laveda Abbe, NP  cetirizine (ZYRTEC) 10 MG tablet Take 10 mg by mouth daily as needed for allergies.    [provider]  citalopram (CELEXA) 20 MG tablet Take 1 tablet (20 mg total) by mouth at bedtime. 08/21/20   Laveda Abbe, NP  hydrOXYzine (ATARAX/VISTARIL) 25 MG tablet Take 1 tablet (25 mg total) by mouth 3 (three) times daily as needed for anxiety. 08/21/20   Laveda Abbe, NP  OLANZapine zydis (ZYPREXA) 10 MG disintegrating tablet Take 1 tablet (10 mg total) by mouth at bedtime. 08/21/20  Laveda AbbeParks, Laurie Britton, NP  traZODone (DESYREL) 100 MG tablet Take 1 tablet (100 mg total) by mouth at bedtime as needed and may repeat dose one time if needed for sleep. 08/21/20   Laveda AbbeParks, Laurie Britton, NP    Allergies    Patient has no known allergies.  Review of Systems   Review of Systems  Constitutional:  Negative for chills, diaphoresis and fever.  HENT:  Negative for ear pain and sore throat.   Eyes:  Negative for pain and visual disturbance.  Respiratory:  Positive for shortness of breath. Negative for cough, hemoptysis, sputum production and wheezing.   Cardiovascular:  Positive for leg swelling. Negative for chest pain, palpitations, claudication, syncope and PND.  Gastrointestinal:  Negative for abdominal pain and vomiting.  Genitourinary:  Negative for dysuria and hematuria.  Musculoskeletal:  Negative for  arthralgias, back pain and neck pain.  Skin:  Negative for color change and rash.  Neurological:  Negative for seizures, syncope and headaches.  All other systems reviewed and are negative.  Physical Exam Updated Vital Signs BP 133/79   Pulse 86   Temp 98.9 F (37.2 C)   Resp 18   SpO2 98%   Physical Exam Vitals and nursing note reviewed.  Constitutional:      General: She is not in acute distress.    Appearance: She is well-developed. She is not ill-appearing.  HENT:     Head: Normocephalic and atraumatic.     Mouth/Throat:     Mouth: Mucous membranes are moist.  Eyes:     Extraocular Movements: Extraocular movements intact.     Conjunctiva/sclera: Conjunctivae normal.     Pupils: Pupils are equal, round, and reactive to light.  Cardiovascular:     Rate and Rhythm: Normal rate and regular rhythm.     Pulses: Normal pulses.     Heart sounds: Normal heart sounds. No murmur heard. Pulmonary:     Effort: Pulmonary effort is normal. No respiratory distress.     Breath sounds: Normal breath sounds. No decreased breath sounds or wheezing.  Abdominal:     Palpations: Abdomen is soft.     Tenderness: There is no abdominal tenderness.  Musculoskeletal:     Cervical back: Neck supple.     Right lower leg: Edema (trace) present.     Left lower leg: Edema (trace) present.  Skin:    General: Skin is warm and dry.     Capillary Refill: Capillary refill takes less than 2 seconds.  Neurological:     General: No focal deficit present.     Mental Status: She is alert.  Psychiatric:        Mood and Affect: Mood normal.    ED Results / Procedures / Treatments   Labs (all labs ordered are listed, but only abnormal results are displayed) Labs Reviewed  COMPREHENSIVE METABOLIC PANEL - Abnormal; Notable for the following components:      Result Value   Glucose, Bld 130 (*)    Albumin 3.4 (*)    Total Bilirubin 0.2 (*)    All other components within normal limits  D-DIMER,  QUANTITATIVE - Abnormal; Notable for the following components:   D-Dimer, Quant 0.62 (*)    All other components within normal limits  RESP PANEL BY RT-PCR (FLU A&B, COVID) ARPGX2  CBC WITH DIFFERENTIAL/PLATELET  I-STAT BETA HCG BLOOD, ED ([facility], [facility], [facility] ONLY)  TROPONIN I (HIGH SENSITIVITY)    EKG EKG Interpretation  Date/Time:  Friday March 22 2021  18:57:43 EDT Ventricular Rate:  102 PR Interval:  157 QRS Duration: 79 QT Interval:  348 QTC Calculation: 454 R Axis:   69 Text Interpretation: Sinus or ectopic atrial tachycardia Borderline repolarization abnormality Confirmed by Virgina Norfolk ([id number]) on 03/22/2021 7:01:24 PM  Radiology CT Angio Chest PE W and/or Wo Contrast  Result Date: 03/22/2021 CLINICAL DATA:  Leg swelling with positive D-dimer EXAM: CT ANGIOGRAPHY CHEST WITH CONTRAST TECHNIQUE: Multidetector CT imaging of the chest was performed using the standard protocol during bolus administration of intravenous contrast. Multiplanar CT image reconstructions and MIPs were obtained to evaluate the vascular anatomy. CONTRAST:  20mL OMNIPAQUE IOHEXOL 350 MG/ML SOLN COMPARISON:  Chest x-ray 03/22/2021 FINDINGS: Cardiovascular: Satisfactory opacification of the pulmonary arteries to the segmental level. No evidence of pulmonary embolism. No pericardial effusion. Nonaneurysmal aorta. No dissection seen. Mediastinum/Nodes: No enlarged mediastinal, hilar, or axillary lymph nodes. Thyroid gland, trachea, and esophagus demonstrate no significant findings. Lungs/Pleura: Small bilateral pleural effusions. Hazy dependent atelectasis. No consolidation or pneumothorax Upper Abdomen: No acute abnormality. Musculoskeletal: No chest wall abnormality. No acute or significant osseous findings. Review of the MIP images confirms the above findings. IMPRESSION: 1. Negative for acute pulmonary embolus or aortic dissection. 2. Small bilateral pleural effusions. Electronically Signed   By: Jasmine Pang M.D.   On:  03/22/2021 22:30   DG Chest Portable 1 View  Result Date: 03/22/2021 CLINICAL DATA:  Shortness of breath, ankle swelling for several weeks EXAM: PORTABLE CHEST 1 VIEW COMPARISON:  Radiograph 04/07/2017 FINDINGS: No consolidation, features of edema, pneumothorax, or effusion. Pulmonary vascularity is normally distributed. The cardiomediastinal contours are unremarkable. No acute osseous or soft tissue abnormality. Telemetry leads overlie the chest. IMPRESSION: No acute cardiopulmonary abnormality. Electronically Signed   By: Kreg Shropshire M.D.   On: 03/22/2021 20:23    Procedures Procedures   Medications Ordered in ED Medications  iohexol (OMNIPAQUE) 350 MG/ML injection 75 mL (75 mLs Intravenous Contrast Given 03/22/21 2222)    ED Course  I have reviewed the triage vital signs and the nursing notes.  Pertinent labs & imaging results that were available during my care of the patient were reviewed by me and considered in my medical decision making (see chart for details).    MDM Rules/Calculators/A&P                          MAHEEN CWIKLA is here with shortness of breath.  Patient with history of schizoaffective disorder.  Normal vitals except for mild tachycardia.  Has had some shortness of breath for the past week.  Has noticed maybe some swelling to her legs.  She denies any cough or sputum production.  No PE risk factors.  However will get D-dimer.  EKG shows sinus tachycardia.  No ischemic changes.  No chest pain.  Doubt ACS.  Will get troponin.  Will get chest x-ray and basic labs.  Will evaluate for infection, PE.  Could be medication related, could be anxiety related.  D-dimer is elevated we will get a CT scan to rule out PE although overall very low suspicion.  Chest x-ray without signs of infection.  Troponin normal.  No significant anemia, electro abnormality, kidney injury otherwise.  COVID test negative.  PE scan negative for blood clot, no pneumonia.  Recommend follow-up with  primary care doctor.  This chart was dictated using voice recognition software.  Despite best efforts to proofread,  errors can occur which can change the documentation meaning.  Final Clinical Impression(s) / ED Diagnoses Final diagnoses:  Shortness of breath    Rx / DC Orders ED Discharge Orders     None        Virgina Norfolk, DO 03/22/21 2241

## 2021-03-22 NOTE — ED Notes (Signed)
Patient and family member verbalizes understanding of discharge instructions. Follow-up care reviewed. Opportunity for questioning and answers were provided. Pt discharged from ED ambulatory.

## 2021-03-22 NOTE — ED Provider Notes (Signed)
Emergency Medicine Provider Triage Evaluation Note  Tracy Bond , a 20 y.o. female  was evaluated in triage.  Pt complains of feeling short of breath and having peripheral edema.  She states that her mother noticed her peripheral edema.  She is uncertain as to how long her symptoms have been persisting.  She takes medications for anxiety and schizoaffective disorder, but denies any other significant past medical history.  Does have history of asthma, but denies any wheezing.  She also denies any cough, fevers or chills, chest pain, or other symptoms.  Review of Systems  Positive: Shortness of breath, peripheral edema Negative: Cough, fever, chills, chest pain  Physical Exam  BP 127/90 (BP Location: Right Arm)   Pulse (!) 105   Temp 98.9 F (37.2 C)   Resp 18   SpO2 99%  Gen:   Awake, no distress   Resp:  Normal effort. CTA bilaterally MSK:   Moves extremities without difficulty. No significant edema. Other:    Medical Decision Making  Medically screening exam initiated at 5:47 PM.  Appropriate orders placed.  Tracy Bond was informed that the remainder of the evaluation will be completed by another provider, this initial triage assessment does not replace that evaluation, and the importance of remaining in the ED until their evaluation is complete.    Lorelee New, PA-C 03/22/21 1747    Virgina Norfolk, DO 03/22/21 2059

## 2021-03-22 NOTE — ED Triage Notes (Signed)
Pt c/o SHOB, leg swelling for unknown time. No pertinent hx, no additional complaints.

## 2021-07-12 ENCOUNTER — Ambulatory Visit (HOSPITAL_COMMUNITY)
Admission: RE | Admit: 2021-07-12 | Discharge: 2021-07-12 | Disposition: A | Discharge status: Home / Self Care | Payer: Medicaid Other | Source: Home / Self Care | Attending: Psychiatry | Admitting: Psychiatry

## 2021-07-12 ENCOUNTER — Ambulatory Visit (HOSPITAL_COMMUNITY)
Admission: EM | Admit: 2021-07-12 | Discharge: 2021-07-13 | Disposition: A | Discharge status: Home / Self Care | Payer: Medicaid Other | Attending: Urology | Admitting: Urology

## 2021-07-12 ENCOUNTER — Other Ambulatory Visit: Payer: Self-pay

## 2021-07-12 DIAGNOSIS — F79 Unspecified intellectual disabilities: Secondary | ICD-10-CM | POA: Insufficient documentation

## 2021-07-12 DIAGNOSIS — F250 Schizoaffective disorder, bipolar type: Secondary | ICD-10-CM | POA: Insufficient documentation

## 2021-07-12 DIAGNOSIS — Z20822 Contact with and (suspected) exposure to covid-19: Secondary | ICD-10-CM | POA: Insufficient documentation

## 2021-07-12 DIAGNOSIS — Z79899 Other long term (current) drug therapy: Secondary | ICD-10-CM | POA: Insufficient documentation

## 2021-07-12 LAB — POCT URINE DRUG SCREEN - MANUAL ENTRY (I-SCREEN)
POC Amphetamine UR: NOT DETECTED
POC Buprenorphine (BUP): NOT DETECTED
POC Cocaine UR: NOT DETECTED
POC Marijuana UR: NOT DETECTED
POC Methadone UR: NOT DETECTED
POC Methamphetamine UR: NOT DETECTED
POC Morphine: NOT DETECTED
POC Oxazepam (BZO): NOT DETECTED
POC Oxycodone UR: NOT DETECTED
POC Secobarbital (BAR): NOT DETECTED

## 2021-07-12 LAB — POCT PREGNANCY, URINE: Preg Test, Ur: NEGATIVE

## 2021-07-12 LAB — POC SARS CORONAVIRUS 2 AG: SARSCOV2ONAVIRUS 2 AG: NEGATIVE

## 2021-07-12 MED ORDER — MAGNESIUM HYDROXIDE 400 MG/5ML PO SUSP: 30.0000 mL | Freq: Every day | ORAL | Status: DC | PRN

## 2021-07-12 MED ORDER — ALUM & MAG HYDROXIDE-SIMETH 200-200-20 MG/5ML PO SUSP: 30.0000 mL | ORAL | Status: DC | PRN

## 2021-07-12 MED ORDER — ACETAMINOPHEN 325 MG PO TABS: 650.0000 mg | ORAL_TABLET | Freq: Four times a day (QID) | ORAL | Status: DC | PRN

## 2021-07-12 NOTE — H&P (Signed)
Behavioral Health Medical Screening Exam  Tracy Bond is a 20 y.o. single female with a history of schizoaffective disorder, bipolar type and intellectual disability presenting voluntarily with her mother [name] with complaint of auditory and visual hallucinations that have been worsening for several weeks. Mother states that she feels that patient's medications need to be adjusted. Mother reports that patient is taking abilify, celexa, buspar and amantadine but was not sure of the dosages of the medications. Patient lives at home with her mother and older sister. Patient's mother reports that patient has been sad, angry, with verbal outburst of yelling, decreased concentration/focus low energy, racing thoughts, and with difficulty getting to sleep and falling asleep. Patient and mother deny any physical aggression by patient. Patient reports that she feels supported by her mother, father and other family members. Patient's mother reports that patient is being followed by Leone Payor at [facility] and has an upcoming appointment this week. Patient's mother reports that she feels patient need a day treatment program for patient to attend during the day when her mother is at work. Mother reports that she plans to pursue legal guardianship of patient in the near future. Patient presents as calm and cooperative during the assessment and wants is willing to come [facility] for medication adjustment. Patient denies any SI or HI.  Total Time spent with patient: 45 minutes  Psychiatric Specialty Exam:  Presentation  General Appearance: Appropriate for Environment  Eye Contact:Fair  Speech:Clear and Coherent  Speech Volume:Normal  Handedness: No data recorded  Mood and Affect  Mood:Depressed; Hopeless  Affect:Blunt   Thought Process  Thought Processes:Coherent  Descriptions of Associations:Intact  Orientation:Full (Time, Place and Person)  Thought  Content:Delusions  History of Schizophrenia/Schizoaffective disorder:Yes  Duration of Psychotic Symptoms:Greater than six months  Hallucinations:Hallucinations: Auditory Description of Auditory Hallucinations: haering voices  Ideas of Reference:Delusions  Suicidal Thoughts:Suicidal Thoughts: No  Homicidal Thoughts:Homicidal Thoughts: No   Sensorium  Memory:Immediate Fair; Remote Fair; Recent Fair  Judgment:Poor  Insight:Poor   Executive Functions  Concentration:Poor  Attention Span:Poor  Recall:Fair  Fund of Knowledge:Fair  Language:Fair   Psychomotor Activity  Psychomotor Activity:Psychomotor Activity: Normal   Assets  Assets:Desire for Improvement; Manufacturing systems engineer; Physical Health; Resilience; Social Support; Housing; Transportation   Sleep  Sleep:Sleep: Poor Number of Hours of Sleep: -1    Physical Exam: Physical Exam HENT:     Head: Normocephalic and atraumatic.     Nose: Nose normal.  Eyes:     Pupils: Pupils are equal, round, and reactive to light.  Cardiovascular:     Rate and Rhythm: Normal rate.  Pulmonary:     Effort: Pulmonary effort is normal.  Musculoskeletal:     Cervical back: Normal range of motion.  Skin:    General: Skin is warm.  Neurological:     Mental Status: She is alert and oriented to person, place, and time.  Psychiatric:        Attention and Perception: She perceives auditory and visual hallucinations.        Mood and Affect: Mood is anxious and depressed. Affect is flat.        Speech: Speech normal.        Behavior: Behavior is cooperative.        Thought Content: Thought content is delusional. Thought content does not include homicidal or suicidal ideation. Thought content does not include homicidal or suicidal plan.        Cognition and Memory: Cognition is impaired.  Judgment: Judgment normal.   Review of Systems  Constitutional: Negative.   HENT: Negative.    Eyes: Negative.   Respiratory:  Negative.    Cardiovascular: Negative.   Gastrointestinal: Negative.   Genitourinary: Negative.   Musculoskeletal: Negative.   Skin: Negative.   Neurological: Negative.   Endo/Heme/Allergies: Negative.   Psychiatric/Behavioral:  Positive for depression and hallucinations. Negative for substance abuse and suicidal ideas. The patient is nervous/anxious.   Blood pressure 122/77, pulse 75, temperature 99.2 F (37.3 C), resp. rate 16. There is no height or weight on file to calculate BMI.  Musculoskeletal: Strength & Muscle Tone: within normal limits Gait & Station: normal Patient leans: N/A   Recommendations:  Based on my evaluation the patient does not appear to have an emergency medical condition. Based on my assessment and TTS assessment patient will benefit from continuous observation and will be transferred to [facility].  Jasper Riling, NP 07/12/2021, 9:10 PM
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
## 2021-07-13 ENCOUNTER — Other Ambulatory Visit: Payer: Self-pay

## 2021-07-13 ENCOUNTER — Ambulatory Visit (HOSPITAL_COMMUNITY)
Admission: EM | Admit: 2021-07-13 | Discharge: 2021-07-14 | Disposition: A | Discharge status: Home / Self Care | Payer: Medicaid Other | Attending: Nurse Practitioner | Admitting: Nurse Practitioner

## 2021-07-13 DIAGNOSIS — F250 Schizoaffective disorder, bipolar type: Secondary | ICD-10-CM | POA: Diagnosis not present

## 2021-07-13 DIAGNOSIS — Z79899 Other long term (current) drug therapy: Secondary | ICD-10-CM | POA: Diagnosis not present

## 2021-07-13 DIAGNOSIS — F79 Unspecified intellectual disabilities: Secondary | ICD-10-CM | POA: Insufficient documentation

## 2021-07-13 DIAGNOSIS — Z20822 Contact with and (suspected) exposure to covid-19: Secondary | ICD-10-CM | POA: Insufficient documentation

## 2021-07-13 LAB — POC SARS CORONAVIRUS 2 AG: SARSCOV2ONAVIRUS 2 AG: NEGATIVE

## 2021-07-13 LAB — CBC WITH DIFFERENTIAL/PLATELET
Abs Immature Granulocytes: 0.02 10*3/uL (ref 0.00–0.07)
Basophils Absolute: 0.1 10*3/uL (ref 0.0–0.1)
Basophils Relative: 1 %
Eosinophils Absolute: 0.1 10*3/uL (ref 0.0–0.5)
Eosinophils Relative: 1 %
HCT: 43.4 % (ref 36.0–46.0)
Hemoglobin: 14.1 g/dL (ref 12.0–15.0)
Immature Granulocytes: 0 %
Lymphocytes Relative: 36 %
Lymphs Abs: 3.1 10*3/uL (ref 0.7–4.0)
MCH: 28.6 pg (ref 26.0–34.0)
MCHC: 32.5 g/dL (ref 30.0–36.0)
MCV: 88 fL (ref 80.0–100.0)
Monocytes Absolute: 0.4 10*3/uL (ref 0.1–1.0)
Monocytes Relative: 4 %
Neutro Abs: 4.9 10*3/uL (ref 1.7–7.7)
Neutrophils Relative %: 58 %
Platelets: 245 10*3/uL (ref 150–400)
RBC: 4.93 MIL/uL (ref 3.87–5.11)
RDW: 13.3 % (ref 11.5–15.5)
WBC: 8.6 10*3/uL (ref 4.0–10.5)
nRBC: 0 % (ref 0.0–0.2)

## 2021-07-13 LAB — LIPID PANEL
Cholesterol: 196 mg/dL (ref 0–200)
HDL: 52 mg/dL (ref >40)
LDL Cholesterol: 120 mg/dL — ABNORMAL HIGH (ref 0–99)
Total CHOL/HDL Ratio: 3.8 RATIO
Triglycerides: 121 mg/dL (ref <150)
VLDL: 24 mg/dL (ref 0–40)

## 2021-07-13 LAB — COMPREHENSIVE METABOLIC PANEL
ALT: 14 U/L (ref 0–44)
AST: 14 U/L — ABNORMAL LOW (ref 15–41)
Albumin: 3.9 g/dL (ref 3.5–5.0)
Alkaline Phosphatase: 72 U/L (ref 38–126)
Anion gap: 10 (ref 5–15)
BUN: 7 mg/dL (ref 6–20)
CO2: 24 mmol/L (ref 22–32)
Calcium: 9.4 mg/dL (ref 8.9–10.3)
Chloride: 104 mmol/L (ref 98–111)
Creatinine, Ser: 0.86 mg/dL (ref 0.44–1.00)
GFR, Estimated: >60 mL/min (ref >60)
Glucose, Bld: 78 mg/dL (ref 70–99)
Potassium: 4.6 mmol/L (ref 3.5–5.1)
Sodium: 138 mmol/L (ref 135–145)
Total Bilirubin: 0.3 mg/dL (ref 0.3–1.2)
Total Protein: 7.2 g/dL (ref 6.5–8.1)

## 2021-07-13 LAB — RESP PANEL BY RT-PCR (FLU A&B, COVID) ARPGX2
Influenza A by PCR: NEGATIVE
Influenza A by PCR: NEGATIVE
Influenza B by PCR: NEGATIVE
Influenza B by PCR: NEGATIVE
SARS Coronavirus 2 by RT PCR: NEGATIVE
SARS Coronavirus 2 by RT PCR: NEGATIVE

## 2021-07-13 LAB — HEMOGLOBIN A1C
Hgb A1c MFr Bld: 5.4 % (ref 4.8–5.6)
Mean Plasma Glucose: 108.28 mg/dL

## 2021-07-13 LAB — TSH: TSH: 2.548 u[IU]/mL (ref 0.350–4.500)

## 2021-07-13 LAB — POC SARS CORONAVIRUS 2 AG -  ED: SARS Coronavirus 2 Ag: NEGATIVE

## 2021-07-13 MED ORDER — ALBUTEROL SULFATE HFA 108 (90 BASE) MCG/ACT IN AERS
2.0000 | INHALATION_SPRAY | RESPIRATORY_TRACT | Status: DC | PRN
Start: 2021-07-13 — End: 2021-07-14

## 2021-07-13 MED ORDER — BUSPIRONE HCL 5 MG PO TABS
10.0000 mg | ORAL_TABLET | Freq: Three times a day (TID) | ORAL | Status: DC
  Administered 2021-07-13 (×2): 10 mg via ORAL
  Filled 2021-07-13 (×2): qty 2

## 2021-07-13 MED ORDER — AMANTADINE HCL 100 MG PO CAPS
100.0000 mg | ORAL_CAPSULE | Freq: Two times a day (BID) | ORAL | Status: DC
  Administered 2021-07-13 – 2021-07-14 (×2): 100 mg via ORAL
  Filled 2021-07-13 (×2): qty 1

## 2021-07-13 MED ORDER — CITALOPRAM HYDROBROMIDE 20 MG PO TABS
20.0000 mg | ORAL_TABLET | Freq: Every day | ORAL | Status: DC
  Administered 2021-07-13: 20 mg via ORAL
  Filled 2021-07-13: qty 1

## 2021-07-13 MED ORDER — CITALOPRAM HYDROBROMIDE 20 MG PO TABS
20.0000 mg | ORAL_TABLET | Freq: Every day | ORAL | Status: DC
Start: 2021-07-13 — End: 2021-07-13
  Administered 2021-07-13: 20 mg via ORAL
  Filled 2021-07-13: qty 1

## 2021-07-13 MED ORDER — ARIPIPRAZOLE 10 MG PO TABS
20.0000 mg | ORAL_TABLET | Freq: Every day | ORAL | Status: DC
Start: 2021-07-13 — End: 2021-07-13
  Administered 2021-07-13: 20 mg via ORAL
  Filled 2021-07-13: qty 2

## 2021-07-13 MED ORDER — BUSPIRONE HCL 5 MG PO TABS
10.0000 mg | ORAL_TABLET | Freq: Three times a day (TID) | ORAL | Status: DC
Start: 2021-07-13 — End: 2021-07-14
  Administered 2021-07-13 – 2021-07-14 (×2): 10 mg via ORAL
  Filled 2021-07-13 (×2): qty 2

## 2021-07-13 MED ORDER — ARIPIPRAZOLE 20 MG PO TABS: 20.0000 mg | ORAL_TABLET | Freq: Every day | ORAL | 0 refills | Status: AC

## 2021-07-13 MED ORDER — LORATADINE 10 MG PO TABS
10.0000 mg | ORAL_TABLET | Freq: Every day | ORAL | Status: DC
  Administered 2021-07-13 – 2021-07-14 (×2): 10 mg via ORAL
  Filled 2021-07-13 (×2): qty 1

## 2021-07-13 MED ORDER — AMANTADINE HCL 100 MG PO CAPS
100.0000 mg | ORAL_CAPSULE | Freq: Every day | ORAL | Status: DC
Start: 2021-07-13 — End: 2021-07-13
  Administered 2021-07-13: 100 mg via ORAL
  Filled 2021-07-13: qty 1

## 2021-07-13 MED ORDER — ARIPIPRAZOLE 10 MG PO TABS
20.0000 mg | ORAL_TABLET | Freq: Every day | ORAL | Status: DC
  Administered 2021-07-13: 20 mg via ORAL
  Filled 2021-07-13: qty 2

## 2021-07-13 MED ORDER — MAGNESIUM HYDROXIDE 400 MG/5ML PO SUSP: 30.0000 mL | Freq: Every day | ORAL | Status: DC | PRN

## 2021-07-13 MED ORDER — ACETAMINOPHEN 325 MG PO TABS: 650.0000 mg | ORAL_TABLET | Freq: Four times a day (QID) | ORAL | Status: DC | PRN

## 2021-07-13 MED ORDER — ALUM & MAG HYDROXIDE-SIMETH 200-200-20 MG/5ML PO SUSP: 30.0000 mL | ORAL | Status: DC | PRN

## 2021-07-13 MED ORDER — HYDROXYZINE HCL 25 MG PO TABS
25.0000 mg | ORAL_TABLET | Freq: Three times a day (TID) | ORAL | Status: DC | PRN
  Administered 2021-07-13: 25 mg via ORAL
  Filled 2021-07-13: qty 1

## 2021-07-13 MED ORDER — TRAZODONE HCL 50 MG PO TABS
50.0000 mg | ORAL_TABLET | Freq: Every evening | ORAL | Status: DC | PRN
  Administered 2021-07-13: 50 mg via ORAL
  Filled 2021-07-13: qty 1

## 2021-07-13 NOTE — ED Provider Notes (Signed)
Behavioral Health Admission H&P [facility] & OBS)  Date: 07/13/21 Patient Name: Tracy Bond MRN: [id number] Chief Complaint:  Chief Complaint  Patient presents with   schizo-affective      Diagnoses:  Final diagnoses:  Schizoaffective disorder, bipolar type (HCC)    HPI: Tracy Bond is a 20 year old female with psychiatric history of schizoaffective disorder, bipolar type and history of intellectual disability.  Patient was seen and evaluated at [facility] for worsening auditory hallucination and was transferred to [facility] for continuous assessment.   Patient was seen face to face and her chart was reviewed upon arrival to [facility]. Patient is alert and oriented, she is calm and cooperative. Patient reports that she has been experiencing auditory hallucination of "voices talking to me and telling things." She shares that she is experiencing visual hallucination of "seeing different facs. She also reports that she becomes easily agitated, yelling at family members and crying. She reports that she is compliant with her medications. She denies SI/HI/paranoia and substance abuse. She denies all medical complaint.   She gave verbal consent for writer to call obtain medication list and to discuss her care with her mother [name] [phone]).  Per patient's mother patient is currently taking: Amanitine 100mg /QHS, Buspar 10mg  TID, Abilify 15mg  QHS, and Citalopram 20mg  QHS. Patient's mother verified information prior provider , NP's note below.    Per , NP 07/12/2021: Tracy Bond is a 20 y.o. single female with a history of schizoaffective disorder, bipolar type and intellectual disability presenting voluntarily with her mother Norma Fredrickson with complaint of auditory and visual hallucinations that have been worsening for several weeks. Mother states that she feels that patient's medications need to be adjusted. Mother reports that patient is taking abilify, celexa,  buspar and amantadine but was not sure of the dosages of the medications. Patient lives at home with her mother and older sister. Patient's mother reports that patient has been sad, angry, with verbal outburst of yelling, decreased concentration/focus low energy, racing thoughts, and with difficulty getting to sleep and falling asleep. Patient and mother deny any physical aggression by patient. Patient reports that she feels supported by her mother, father and other family members. Patient's mother reports that patient is being followed by 07/14/2021 at [facility] and has an upcoming appointment this week. Patient's mother reports that she feels patient need a day treatment program for patient to attend during the day when her mother is at work. Mother reports that she plans to pursue legal guardianship of patient in the near future. Patient presents as calm and cooperative during the assessment and wants is willing to come [facility] for medication adjustment. Patient denies any SI or HI.  PHQ 2-9:   Flowsheet Row ED from 07/12/2021 in [facility] Most recent reading at 07/12/2021 11:40 PM OP [facility] Most recent reading at 07/12/2021 10:00 PM Admission (Discharged) [facility] Most recent reading at 08/16/2020 10:00 PM  C-SSRS RISK CATEGORY No Risk No Risk Error: Question 2 not populated        Total Time spent with patient: 20 minutes  Musculoskeletal  Strength & Muscle Tone: within normal limits Gait & Station: normal Patient leans: Right  Psychiatric Specialty Exam  Presentation General Appearance: Appropriate for Environment  Eye Contact:Good  Speech:Clear and Coherent  Speech Volume:Normal  Handedness:Right   Mood and Affect  Mood:Depressed  Affect:Blunt  Thought Process  Thought Processes:Coherent  Descriptions  of Associations:Intact  Orientation:Full (Time, Place and Person)  Thought Content:Delusions  Diagnosis of Schizophrenia or Schizoaffective disorder in past: Yes  Duration of Psychotic Symptoms: Greater than six months  Hallucinations:Hallucinations: Auditory Description of Auditory Hallucinations: haering voices  Ideas of Reference:Delusions  Suicidal Thoughts:Suicidal Thoughts: No  Homicidal Thoughts:Homicidal Thoughts: No   Sensorium  Memory:Immediate Fair; Recent Fair; Remote Fair  Judgment:Poor  Insight:Fair   Executive Functions  Concentration:Fair  Attention Span:Poor  Recall:Fair  Fund of Knowledge:Fair  Language:Fair   Psychomotor Activity  Psychomotor Activity:Psychomotor Activity: Normal   Assets  Assets:Desire for Improvement; Social Support; Physical Health; Housing; Financial Resources/Insurance   Sleep  Sleep:Sleep: Poor Number of Hours of Sleep: -1   Nutritional Assessment (For OBS and [facility]BC admissions only) Has the patient had a weight loss or gain of 10 pounds or more in the last 3 months?: Yes Has the patient had a decrease in food intake/or appetite?: No Does the patient have dental problems?: No Does the patient have eating habits or behaviors that may be indicators of an eating disorder including binging or inducing vomiting?: No Has the patient recently lost weight without trying?: 0 Has the patient been eating poorly because of a decreased appetite?: 0 Malnutrition Screening Tool Score: 0   Physical Exam Vitals and nursing note reviewed.  Constitutional:      General: She is not in acute distress.    Appearance: She is well-developed.  HENT:     Head: Normocephalic and atraumatic.  Eyes:     Conjunctiva/sclera: Conjunctivae normal.  Cardiovascular:     Rate and Rhythm: Normal rate.  Pulmonary:     Effort: Pulmonary effort is normal. No respiratory distress.     Breath sounds: Normal breath sounds.  Abdominal:      Palpations: Abdomen is soft.     Tenderness: There is no abdominal tenderness.  Musculoskeletal:     Cervical back: Neck supple.  Skin:    General: Skin is warm and dry.  Neurological:     Mental Status: She is alert and oriented to person, place, and time.  Psychiatric:        Attention and Perception: She perceives auditory and visual hallucinations.        Mood and Affect: Mood and affect normal.        Speech: Speech normal.        Behavior: Behavior normal. Behavior is cooperative.        Thought Content: Thought content normal.        Cognition and Memory: Cognition is impaired.   Review of Systems  Constitutional: Negative.   HENT: Negative.    Eyes: Negative.   Respiratory: Negative.    Cardiovascular: Negative.   Gastrointestinal: Negative.   Genitourinary: Negative.   Musculoskeletal: Negative.   Skin: Negative.   Neurological: Negative.   Endo/Heme/Allergies: Negative.   Psychiatric/Behavioral: Negative.     Blood pressure 133/86, pulse 84, temperature 98.3 F (36.8 C), temperature source Oral, resp. rate 16, SpO2 100 %. There is no height or weight on file to calculate BMI.  Past Psychiatric History: Schizoaffective disorder, biploar   Is the patient at risk to self? No  Has the patient been a risk to self in the past 6 months? No .    Has the patient been a risk to self within the distant past? No   Is the patient a risk to others? No   Has the patient been a  risk to others in the past 6 months? No   Has the patient been a risk to others within the distant past? No   Past Medical History:  Past Medical History:  Diagnosis Date   Asthma    Medical history non-contributory    Psychosis (HCC) 03/19/2017   No past surgical history on file.  Family History:  Family History  Problem Relation Age of Onset   Mental illness Neg Hx     Social History:  Social History   Socioeconomic History   Marital status: Single    Spouse name: Not on file   Number  of children: Not on file   Years of education: Not on file   Highest education level: Not on file  Occupational History   Not on file  Tobacco Use   Smoking status: Never   Smokeless tobacco: Never  Substance and Sexual Activity   Alcohol use: No    Comment: Pt denied; BAC not available at time of assessment   Drug use: No    Comment: Pt denied; UDS not available at time of assessment   Sexual activity: Never  Other Topics Concern   Not on file  Social History Narrative   Not on file   Social Determinants of Health   Financial Resource Strain: Not on file  Food Insecurity: Not on file  Transportation Needs: Not on file  Physical Activity: Not on file  Stress: Not on file  Social Connections: Not on file  Intimate Partner Violence: Not on file    SDOH:  SDOH Screenings   Alcohol Screen: Low Risk    Last Alcohol Screening Score (AUDIT): 0  Depression (PHQ2-9): Not on file  Financial Resource Strain: Not on file  Food Insecurity: Not on file  Housing: Not on file  Physical Activity: Not on file  Social Connections: Not on file  Stress: Not on file  Tobacco Use: Low Risk    Smoking Tobacco Use: Never   Smokeless Tobacco Use: Never  Transportation Needs: Not on file    Last Labs:  Admission on 07/12/2021  Component Date Value Ref Range Status   SARS Coronavirus 2 by RT PCR 07/12/2021 NEGATIVE  NEGATIVE Final   Comment: (NOTE) SARS-CoV-2 target nucleic acids are NOT DETECTED.  The SARS-CoV-2 RNA is generally detectable in upper respiratory specimens during the acute phase of infection. The lowest concentration of SARS-CoV-2 viral copies this assay can detect is 138 copies/mL. A negative result does not preclude SARS-Cov-2 infection and should not be used as the sole basis for treatment or other patient management decisions. A negative result may occur with  improper specimen collection/handling, submission of specimen other than nasopharyngeal swab, presence of  viral mutation(s) within the areas targeted by this assay, and inadequate number of viral copies(<138 copies/mL). A negative result must be combined with clinical observations, patient history, and epidemiological information. The expected result is Negative.  Fact Sheet for Patients:  BloggerCourse.com  Fact Sheet for Healthcare Providers:  SeriousBroker.it  This test is no                          t yet approved or cleared by the [place] FDA and  has been authorized for detection and/or diagnosis of SARS-CoV-2 by FDA under an Emergency Use Authorization (EUA). This EUA will remain  in effect (meaning this test can be used) for the duration of the COVID-19 declaration under Section 564(b)(1) of the Act,  21 U.S.C.section 360bbb-3(b)(1), unless the authorization is terminated  or revoked sooner.       Influenza A by PCR 07/12/2021 NEGATIVE  NEGATIVE Final   Influenza B by PCR 07/12/2021 NEGATIVE  NEGATIVE Final   Comment: (NOTE) The Xpert Xpress SARS-CoV-2/FLU/RSV plus assay is intended as an aid in the diagnosis of influenza from Nasopharyngeal swab specimens and should not be used as a sole basis for treatment. Nasal washings and aspirates are unacceptable for Xpert Xpress SARS-CoV-2/FLU/RSV testing.  Fact Sheet for Patients: BloggerCourse.com  Fact Sheet for Healthcare Providers: SeriousBroker.it  This test is not yet approved or cleared by the [place] FDA and has been authorized for detection and/or diagnosis of SARS-CoV-2 by FDA under an Emergency Use Authorization (EUA). This EUA will remain in effect (meaning this test can be used) for the duration of the COVID-19 declaration under Section 564(b)(1) of the Act, 21 U.S.C. section 360bbb-3(b)(1), unless the authorization is terminated or revoked.  Performed at [facility], [address],  [place], [place] [address]    WBC 07/12/2021 8.6  4.0 - 10.5 K/uL Final   RBC 07/12/2021 4.93  3.87 - 5.11 MIL/uL Final   Hemoglobin 07/12/2021 14.1  12.0 - 15.0 g/dL Final   HCT 18/29/9371 43.4  36.0 - 46.0 % Final   MCV 07/12/2021 88.0  80.0 - 100.0 fL Final   MCH 07/12/2021 28.6  26.0 - 34.0 pg Final   MCHC 07/12/2021 32.5  30.0 - 36.0 g/dL Final   RDW 69/67/8938 13.3  11.5 - 15.5 % Final   Platelets 07/12/2021 245  150 - 400 K/uL Final   nRBC 07/12/2021 0.0  0.0 - 0.2 % Final   Neutrophils Relative % 07/12/2021 58  % Final   Neutro Abs 07/12/2021 4.9  1.7 - 7.7 K/uL Final   Lymphocytes Relative 07/12/2021 36  % Final   Lymphs Abs 07/12/2021 3.1  0.7 - 4.0 K/uL Final   Monocytes Relative 07/12/2021 4  % Final   Monocytes Absolute 07/12/2021 0.4  0.1 - 1.0 K/uL Final   Eosinophils Relative 07/12/2021 1  % Final   Eosinophils Absolute 07/12/2021 0.1  0.0 - 0.5 K/uL Final   Basophils Relative 07/12/2021 1  % Final   Basophils Absolute 07/12/2021 0.1  0.0 - 0.1 K/uL Final   Immature Granulocytes 07/12/2021 0  % Final   Abs Immature Granulocytes 07/12/2021 0.02  0.00 - 0.07 K/uL Final   Performed at [facility], [address], [place], [place] [address]   Sodium 07/12/2021 138  135 - 145 mmol/L Final   Potassium 07/12/2021 4.6  3.5 - 5.1 mmol/L Final   Chloride 07/12/2021 104  98 - 111 mmol/L Final   CO2 07/12/2021 24  22 - 32 mmol/L Final   Glucose, Bld 07/12/2021 78  70 - 99 mg/dL Final   Glucose reference range applies only to samples taken after fasting for at least 8 hours.   BUN 07/12/2021 7  6 - 20 mg/dL Final   Creatinine, Ser 07/12/2021 0.86  0.44 - 1.00 mg/dL Final   Calcium 07/23/8526 9.4  8.9 - 10.3 mg/dL Final   Total Protein 78/24/2353 7.2  6.5 - 8.1 g/dL Final   Albumin 61/44/3154 3.9  3.5 - 5.0 g/dL Final   AST 00/86/7619 14 (A) 15 - 41 U/L Final   ALT 07/12/2021 14  0 - 44 U/L Final   Alkaline Phosphatase 07/12/2021 72  38 - 126 U/L Final  Total Bilirubin  07/12/2021 0.3  0.3 - 1.2 mg/dL Final   GFR, Estimated 07/12/2021 >60  >60 mL/min Final   Comment: (NOTE) Calculated using the CKD-EPI Creatinine Equation (2021)    Anion gap 07/12/2021 10  5 - 15 Final   Performed at [facility], [address] [place], [place] [address]   Hgb A1c MFr Bld 07/12/2021 5.4  4.8 - 5.6 % Final   Comment: (NOTE) Pre diabetes:          5.7%-6.4%  Diabetes:              >6.4%  Glycemic control for   <7.0% adults with diabetes    Mean Plasma Glucose 07/12/2021 108.28  mg/dL Final   Performed at [facility], [address] [place], [place] [address]   TSH 07/12/2021 2.548  0.350 - 4.500 uIU/mL Final   Comment: Performed by a 3rd Generation assay with a functional sensitivity of <=0.01 uIU/mL. Performed at [facility], [address], [place], [place] [address]    POC Amphetamine UR 07/12/2021 None Detected  NONE DETECTED (Cut Off Level 1000 ng/mL) Final   POC Secobarbital (BAR) 07/12/2021 None Detected  NONE DETECTED (Cut Off Level 300 ng/mL) Final   POC Buprenorphine (BUP) 07/12/2021 None Detected  NONE DETECTED (Cut Off Level 10 ng/mL) Final   POC Oxazepam (BZO) 07/12/2021 None Detected  NONE DETECTED (Cut Off Level 300 ng/mL) Final   POC Cocaine UR 07/12/2021 None Detected  NONE DETECTED (Cut Off Level 300 ng/mL) Final   POC Methamphetamine UR 07/12/2021 None Detected  NONE DETECTED (Cut Off Level 1000 ng/mL) Final   POC Morphine 07/12/2021 None Detected  NONE DETECTED (Cut Off Level 300 ng/mL) Final   POC Oxycodone UR 07/12/2021 None Detected  NONE DETECTED (Cut Off Level 100 ng/mL) Final   POC Methadone UR 07/12/2021 None Detected  NONE DETECTED (Cut Off Level 300 ng/mL) Final   POC Marijuana UR 07/12/2021 None Detected  NONE DETECTED (Cut Off Level 50 ng/mL) Final   SARSCOV2ONAVIRUS 2 AG 07/12/2021 NEGATIVE  NEGATIVE Final   Comment: (NOTE) SARS-CoV-2 antigen NOT DETECTED.   Negative results are presumptive.  Negative results  do not preclude SARS-CoV-2 infection and should not be used as the sole basis for treatment or other patient management decisions, including infection  control decisions, particularly in the presence of clinical signs and  symptoms consistent with COVID-19, or in those who have been in contact with the virus.  Negative results must be combined with clinical observations, patient history, and epidemiological information. The expected result is Negative.  Fact Sheet for Patients: https://www.jennings-kim.com/  Fact Sheet for Healthcare Providers: https://alexander-rogers.biz/  This test is not yet approved or cleared by the [place] FDA and  has been authorized for detection and/or diagnosis of SARS-CoV-2 by FDA under an Emergency Use Authorization (EUA).  This EUA will remain in effect (meaning this test can be used) for the duration of  the COV                          ID-19 declaration under Section 564(b)(1) of the Act, 21 U.S.C. section 360bbb-3(b)(1), unless the authorization is terminated or revoked sooner.     Preg Test, Ur 07/12/2021 NEGATIVE  NEGATIVE Final   Comment:        THE SENSITIVITY OF THIS METHODOLOGY IS >24 mIU/mL    Cholesterol 07/12/2021 196  0 - 200 mg/dL  Final   Triglycerides 07/12/2021 121  <150 mg/dL Final   HDL 16/06/9603 52  >40 mg/dL Final   Total CHOL/HDL Ratio 07/12/2021 3.8  RATIO Final   VLDL 07/12/2021 24  0 - 40 mg/dL Final   LDL Cholesterol 07/12/2021 120 (A) 0 - 99 mg/dL Final   Comment:        Total Cholesterol/HDL:CHD Risk Coronary Heart Disease Risk Table                     Men   Women  1/2 Average Risk   3.4   3.3  Average Risk       5.0   4.4  2 X Average Risk   9.6   7.1  3 X Average Risk  23.4   11.0        Use the calculated Patient Ratio above and the CHD Risk Table to determine the patient's CHD Risk.        ATP III CLASSIFICATION (LDL):  <100     mg/dL   Optimal  540-981  mg/dL   Near or  Above                    Optimal  130-159  mg/dL   Borderline  191-478  mg/dL   High  >295     mg/dL   Very High Performed at [facility], [address] [place], [place] [address]   Admission on 03/22/2021, Discharged on 03/22/2021  Component Date Value Ref Range Status   I-stat hCG, quantitative 03/22/2021 <5.0  <5 mIU/mL Final   Comment 3 03/22/2021          Final   Comment:   GEST. AGE      CONC.  (mIU/mL)   <=1 WEEK        5 - 50     2 WEEKS       50 - 500     3 WEEKS       100 - 10,000     4 WEEKS     1,000 - 30,000        FEMALE AND NON-PREGNANT FEMALE:     LESS THAN 5 mIU/mL    WBC 03/22/2021 8.9  4.0 - 10.5 K/uL Final   RBC 03/22/2021 4.54  3.87 - 5.11 MIL/uL Final   Hemoglobin 03/22/2021 13.0  12.0 - 15.0 g/dL Final   HCT 86/57/8469 39.8  36.0 - 46.0 % Final   MCV 03/22/2021 87.7  80.0 - 100.0 fL Final   MCH 03/22/2021 28.6  26.0 - 34.0 pg Final   MCHC 03/22/2021 32.7  30.0 - 36.0 g/dL Final   RDW 62/95/2841 13.4  11.5 - 15.5 % Final   Platelets 03/22/2021 224  150 - 400 K/uL Final   nRBC 03/22/2021 0.0  0.0 - 0.2 % Final   Neutrophils Relative % 03/22/2021 70  % Final   Neutro Abs 03/22/2021 6.2  1.7 - 7.7 K/uL Final   Lymphocytes Relative 03/22/2021 23  % Final   Lymphs Abs 03/22/2021 2.1  0.7 - 4.0 K/uL Final   Monocytes Relative 03/22/2021 5  % Final   Monocytes Absolute 03/22/2021 0.4  0.1 - 1.0 K/uL Final   Eosinophils Relative 03/22/2021 2  % Final   Eosinophils Absolute 03/22/2021 0.1  0.0 - 0.5 K/uL Final   Basophils Relative 03/22/2021 0  % Final   Basophils Absolute 03/22/2021 0.0  0.0 - 0.1 K/uL Final  Immature Granulocytes 03/22/2021 0  % Final   Abs Immature Granulocytes 03/22/2021 0.03  0.00 - 0.07 K/uL Final   Performed at [facility], [address], [place], [place] [address]   Sodium 03/22/2021 137  135 - 145 mmol/L Final   Potassium 03/22/2021 3.9  3.5 - 5.1 mmol/L Final   Chloride 03/22/2021 106  98 - 111 mmol/L Final   CO2  03/22/2021 23  22 - 32 mmol/L Final   Glucose, Bld 03/22/2021 130 (A) 70 - 99 mg/dL Final   Glucose reference range applies only to samples taken after fasting for at least 8 hours.   BUN 03/22/2021 9  6 - 20 mg/dL Final   Creatinine, Ser 03/22/2021 0.83  0.44 - 1.00 mg/dL Final   Calcium 36/62/9476 8.9  8.9 - 10.3 mg/dL Final   Total Protein 54/65/0354 6.8  6.5 - 8.1 g/dL Final   Albumin 65/68/1275 3.4 (A) 3.5 - 5.0 g/dL Final   AST 17/00/1749 21  15 - 41 U/L Final   ALT 03/22/2021 21  0 - 44 U/L Final   Alkaline Phosphatase 03/22/2021 57  38 - 126 U/L Final   Total Bilirubin 03/22/2021 0.2 (A) 0.3 - 1.2 mg/dL Final   GFR, Estimated 03/22/2021 >60  >60 mL/min Final   Comment: (NOTE) Calculated using the CKD-EPI Creatinine Equation (2021)    Anion gap 03/22/2021 8  5 - 15 Final   Performed at [facility], [address] [place], [place] [address]   D-Dimer, Quant 03/22/2021 0.62 (A) 0.00 - 0.50 ug/mL-FEU Final   Comment: (NOTE) At the manufacturer cut-off value of 0.5 g/mL FEU, this assay has a negative predictive value of 95-100%.This assay is intended for use in conjunction with a clinical pretest probability (PTP) assessment model to exclude pulmonary embolism (PE) and deep venous thrombosis (DVT) in outpatients suspected of PE or DVT. Results should be correlated with clinical presentation. Performed at [facility], [address] [place], [place] [address]    Troponin I (High Sensitivity) 03/22/2021 3  <18 ng/L Final   Comment: (NOTE) Elevated high sensitivity troponin I (hsTnI) values and significant  changes across serial measurements may suggest ACS but many other  chronic and acute conditions are known to elevate hsTnI results.  Refer to the "Links" section for chest pain algorithms and additional  guidance. Performed at [facility], [address], [place], [place] [address]    SARS Coronavirus 2 by RT PCR 03/22/2021 NEGATIVE  NEGATIVE Final    Comment: (NOTE) SARS-CoV-2 target nucleic acids are NOT DETECTED.  The SARS-CoV-2 RNA is generally detectable in upper respiratory specimens during the acute phase of infection. The lowest concentration of SARS-CoV-2 viral copies this assay can detect is 138 copies/mL. A negative result does not preclude SARS-Cov-2 infection and should not be used as the sole basis for treatment or other patient management decisions. A negative result may occur with  improper specimen collection/handling, submission of specimen other than nasopharyngeal swab, presence of viral mutation(s) within the areas targeted by this assay, and inadequate number of viral copies(<138 copies/mL). A negative result must be combined with clinical observations, patient history, and epidemiological information. The expected result is Negative.  Fact Sheet for Patients:  BloggerCourse.com  Fact Sheet for Healthcare Providers:  SeriousBroker.it  This test is no  t yet approved or cleared by the [place] and  has been authorized for detection and/or diagnosis of SARS-CoV-2 by FDA under an Emergency Use Authorization (EUA). This EUA will remain  in effect (meaning this test can be used) for the duration of the COVID-19 declaration under Section 564(b)(1) of the Act, 21 U.S.C.section 360bbb-3(b)(1), unless the authorization is terminated  or revoked sooner.       Influenza A by PCR 03/22/2021 NEGATIVE  NEGATIVE Final   Influenza B by PCR 03/22/2021 NEGATIVE  NEGATIVE Final   Comment: (NOTE) The Xpert Xpress SARS-CoV-2/FLU/RSV plus assay is intended as an aid in the diagnosis of influenza from Nasopharyngeal swab specimens and should not be used as a sole basis for treatment. Nasal washings and aspirates are unacceptable for Xpert Xpress SARS-CoV-2/FLU/RSV testing.  Fact Sheet for  Patients: BloggerCourse.com  Fact Sheet for Healthcare Providers: SeriousBroker.it  This test is not yet approved or cleared by the [place] FDA and has been authorized for detection and/or diagnosis of SARS-CoV-2 by FDA under an Emergency Use Authorization (EUA). This EUA will remain in effect (meaning this test can be used) for the duration of the COVID-19 declaration under Section 564(b)(1) of the Act, 21 U.S.C. section 360bbb-3(b)(1), unless the authorization is terminated or revoked.  Performed at [facility], [address] [place], [place] [address]     Allergies: Patient has no known allergies.  PTA Medications: (Not in a hospital admission)   Medical Decision Making  Patient will be admitted to [facility] for continuous assessment and stabilization with follow by psychiatric on dayshift  -order place for labs -restart home medication -increase Abilify /day to Abilify /day   Recommendations  Based on my evaluation the patient does not appear to have an emergency medical condition.  Maricela Bo, NP 07/13/21  5:26 AM
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
## 2021-07-13 NOTE — ED Notes (Signed)
Sleeping in bed 1 adult [facility]UC with no distress or complaints noted.

## 2021-07-13 NOTE — ED Notes (Addendum)
Call with pt mother [name]  of [place] who went over list of medications and dosages as well as her preferred pharmacy. Mother stated that [facility] doctor discontinued  her Zyprexa and trazodone and continued her other meds. Mother fills that Darika has developed a tolerance for Abilify which she has been taking for sometime now. Further stating that she notice that her behavior was well controlled at the beginning of her abilify  therapy but has slowly become less effective. Mom wonders if a dosage adjustment is needed to help improve Destinae's ability control her feelings.  Mother listed meds as:  Amantadine100 mg qd Albuteral inhaler PRN Abilify 15 mg qd Buspar 10 mg qd Zyrtec 10 mg qd Celexa 20 mg qd Atarax 25 mg

## 2021-07-13 NOTE — Discharge Instructions (Signed)
Take all medications as prescribed. Keep all follow-up appointments as scheduled.  Do not consume alcohol or use illegal drugs while on prescription medications. Report any adverse effects from your medications to your primary care provider promptly.  In the event of recurrent symptoms or worsening symptoms, call 911, a crisis hotline, or go to the nearest emergency department for evaluation.   

## 2021-07-13 NOTE — ED Notes (Signed)
Pt A&O x 4, presents with agitation and anger issues with family.  No violence noted.  Denies SI, HI or AVH.  Skin search completed, monitoring for safety.

## 2021-07-13 NOTE — ED Notes (Signed)
Meal provided 

## 2021-07-13 NOTE — ED Notes (Signed)
Discharge instructions provided and Pt stated understanding. Pt alert, orient and ambulatory prior to d/c from facility. Personal belongings returned from locker number 70. Pt escorted to the front lobby to meet mom and d/c home. Safety maintained.

## 2021-07-13 NOTE — ED Provider Notes (Signed)
Behavioral Health Admission [facility]&P [facility] & OBS)  Date: 07/13/21 Patient Name: Tracy Bond MRN: [id number] Chief Complaint: worsening auditory and visual hallucinations Chief Complaint  Patient presents with   Schizophrenia      Diagnoses:  Final diagnoses:  Schizoaffective disorder, bipolar type (HCC)    HPI: Tracy Bond is a 20 y.o. single female with a history of schizoaffective disorder, bipolar type and intellectual disability presenting voluntarily with her mother Tracy Bond with complaint of auditory and visual hallucinations that have been worsening for several weeks. Patient presented to [facility] on 07/12/21 with a similar presentation.  Of note patient was discharged on 07/13/21, earlier today from [facility]. At that time patient stated that she was no longer experiencing AVH and patient was released to the custody of her mother. Patient's abilify 15mg  was increased  abilify 20 mgPatient with her mother again presented to [facility] later that evening with similar complaint of auditory and visual hallucinations. At this time patient's mother [facility] reports that patient seems to be responding to some internal stimuli. Mother reports this evening patient started yelling, screaming, cursing and pointing at the wall. Mother emphasizes that patient is not physically aggressive or threatening. Mother and patient are living with patient's older sister in a one bedroom apartment and she is worried about neighbors complaining and jeopardizing her daughter's apartment. Patient's outbursts has the caused the upstairs neighbor to stomp on the floor as if to tell them they are being too loud. Patient would also random yell out to her mother, "I am not a bitch" as if her mother had called her a bitch which her mother denies. Mother reports that patient is normally very pleasant and would not curse at her which is a very unusual presentation for patient. Mother reports that  patient has been trying to leave the house and she is very worried about patient's safety if she leaves the home by herself due to her intellectual disability and AVH. Patient is unable to articulate why she is yelling or describe what she is seeing or hearing. Patient tonight is perseverating on the thought that her mother told her she cannot back home. Mother reports that she does not feel that she can keep patient safe at home. Patient and mother deny any alcohol or any other substance by patient. Patient denies any SI or HI but endorses auditory and visual hallucinations. Patient will be admitted to [facility] for continuous assessment.  07-12-21 Mother stated, that she feels that patient's medications need to be adjusted. Mother reports that patient is taking abilify, celexa, buspar and amantadine but was not sure of the dosages of the medications. Patient lives at home with her mother and older sister. Patient's mother reports that patient has been sad, angry, with verbal outburst of yelling, decreased concentration/focus low energy, racing thoughts, and with difficulty getting to sleep and falling asleep. Patient and mother deny any physical aggression by patient. Patient reports that she feels supported by her mother, father and other family members. Patient's mother reports that patient is being followed by 07-14-21 at [facility] and has an upcoming appointment this week.     PHQ 2-9:   Flowsheet Row ED from 07/12/2021 in [facility] Most recent reading at 07/12/2021 11:40 PM OP [facility] Most recent reading at 07/12/2021 10:00 PM Admission (Discharged) [facility] Most recent reading at  08/16/2020 10:00 PM  C-SSRS RISK CATEGORY No Risk No Risk Error: Question 2 not populated        Total Time spent with patient: 30  minutes  Musculoskeletal  Strength & Muscle Tone: within normal limits Gait & Station: normal Patient leans: N/A  Psychiatric Specialty Exam  Presentation General Appearance: Appropriate for Environment  Eye Contact:Fair  Speech:Clear and Coherent  Speech Volume:Normal  Handedness:Right   Mood and Affect  Mood:Anxious; Dysphoric  Affect:Flat; Blunt   Thought Process  Thought Processes:Coherent  Descriptions of Associations:Tangential  Orientation:Full (Time, Place and Person)  Thought Content:Tangential  Diagnosis of Schizophrenia or Schizoaffective disorder in past: Yes  Duration of Psychotic Symptoms: Greater than six months  Hallucinations:Hallucinations: Auditory; Visual Description of Auditory Hallucinations: hearing voices Description of Visual Hallucinations: unable to describe  Ideas of Reference:Paranoia  Suicidal Thoughts:Suicidal Thoughts: No  Homicidal Thoughts:Homicidal Thoughts: No   Sensorium  Memory:Immediate Fair; Recent Fair; Remote Fair  Judgment:Fair  Insight:Fair   Executive Functions  Concentration:Fair  Attention Span:Fair  Recall:Poor  Fund of Knowledge:Fair  Language:Fair   Psychomotor Activity  Psychomotor Activity:Psychomotor Activity: Normal   Assets  Assets:Desire for Improvement; Housing; Physical Health; Social Support   Sleep  Sleep:Sleep: Fair Number of Hours of Sleep: 6   Nutritional Assessment (For OBS and [facility]BC admissions only) Has the patient had a weight loss or gain of 10 pounds or more in the last 3 months?: No Has the patient had a decrease in food intake/or appetite?: No Does the patient have dental problems?: No Does the patient have eating habits or behaviors that may be indicators of an eating disorder including binging or inducing vomiting?: No Has the patient recently lost weight without trying?: 0 Has the patient been eating poorly because of a decreased appetite?: 0 Malnutrition  Screening Tool Score: 0    Physical Exam HENT:     Head: Normocephalic and atraumatic.     Nose: Nose normal.  Cardiovascular:     Rate and Rhythm: Normal rate.  Pulmonary:     Effort: Pulmonary effort is normal.  Musculoskeletal:        General: Normal range of motion.  Skin:    General: Skin is warm.  Neurological:     Mental Status: She is alert and oriented to person, place, and time.  Psychiatric:        Attention and Perception: She perceives auditory and visual hallucinations.        Mood and Affect: Mood is anxious and depressed.        Speech: Speech normal.        Behavior: Behavior is agitated.        Thought Content: Thought content is paranoid and delusional. Thought content does not include homicidal or suicidal ideation. Thought content does not include homicidal or suicidal plan.        Cognition and Memory: Cognition normal.        Judgment: Judgment is impulsive.   Review of Systems  Constitutional: Negative.   HENT: Negative.    Eyes: Negative.   Respiratory: Negative.    Cardiovascular: Negative.   Gastrointestinal: Negative.   Genitourinary: Negative.   Musculoskeletal: Negative.   Skin: Negative.   Neurological: Negative.   Endo/Heme/Allergies: Negative.   Psychiatric/Behavioral:  Positive for depression and hallucinations.    Blood pressure 126/82, pulse 86, temperature 98.6 F (37 C), temperature source Oral, resp. rate 18, SpO2 97 %. There is no height or weight on file to calculate BMI.  Past Psychiatric History: [facility] admission [facility] for auditory and visual hallucinations  Is the patient at risk to self? No  Has the patient been a risk to self in the past 6 months? No .    Has the patient been a risk to self within the distant past? No   Is the patient a risk to others? No   Has the patient been a risk to others in the past 6 months? No   Has the patient been a risk to others within the distant past? No   Past Medical  History:  Past Medical History:  Diagnosis Date   Asthma    Medical history non-contributory    Psychosis (HCC) 03/19/2017   No past surgical history on file.  Family History:  Family History  Problem Relation Age of Onset   Mental illness Neg Hx     Social History:  Social History   Socioeconomic History   Marital status: Single    Spouse name: Not on file   Number of children: Not on file   Years of education: Not on file   Highest education level: Not on file  Occupational History   Not on file  Tobacco Use   Smoking status: Never   Smokeless tobacco: Never  Substance and Sexual Activity   Alcohol use: No    Comment: Pt denied; BAC not available at time of assessment   Drug use: No    Comment: Pt denied; UDS not available at time of assessment   Sexual activity: Never  Other Topics Concern   Not on file  Social History Narrative   Not on file   Social Determinants of Health   Financial Resource Strain: Not on file  Food Insecurity: Not on file  Transportation Needs: Not on file  Physical Activity: Not on file  Stress: Not on file  Social Connections: Not on file  Intimate Partner Violence: Not on file    SDOH:  SDOH Screenings   Alcohol Screen: Low Risk    Last Alcohol Screening Score (AUDIT): 0  Depression (PHQ2-9): Not on file  Financial Resource Strain: Not on file  Food Insecurity: Not on file  Housing: Not on file  Physical Activity: Not on file  Social Connections: Not on file  Stress: Not on file  Tobacco Use: Low Risk    Smoking Tobacco Use: Never   Smokeless Tobacco Use: Never  Transportation Needs: Not on file    Last Labs:  Admission on 07/12/2021, Discharged on 07/13/2021  Component Date Value Ref Range Status   SARS Coronavirus 2 by RT PCR 07/12/2021 NEGATIVE  NEGATIVE Final   Comment: (NOTE) SARS-CoV-2 target nucleic acids are NOT DETECTED.  The SARS-CoV-2 RNA is generally detectable in upper respiratory specimens during the  acute phase of infection. The lowest concentration of SARS-CoV-2 viral copies this assay can detect is 138 copies/mL. A negative result does not preclude SARS-Cov-2 infection and should not be used as the sole basis for treatment or other patient management decisions. A negative result may occur with  improper specimen collection/handling, submission of specimen other than nasopharyngeal swab, presence of viral mutation(s) within the areas targeted by this assay, and inadequate number of viral copies(<138 copies/mL). A negative result must be combined with clinical observations, patient history, and epidemiological information. The expected result is Negative.  Fact Sheet for Patients:  BloggerCourse.com  Fact Sheet for Healthcare Providers:  SeriousBroker.it  This test is no  t yet approved or cleared by the [place] and  has been authorized for detection and/or diagnosis of SARS-CoV-2 by FDA under an Emergency Use Authorization (EUA). This EUA will remain  in effect (meaning this test can be used) for the duration of the COVID-19 declaration under Section 564(b)(1) of the Act, 21 U.S.C.section 360bbb-3(b)(1), unless the authorization is terminated  or revoked sooner.       Influenza A by PCR 07/12/2021 NEGATIVE  NEGATIVE Final   Influenza B by PCR 07/12/2021 NEGATIVE  NEGATIVE Final   Comment: (NOTE) The Xpert Xpress SARS-CoV-2/FLU/RSV plus assay is intended as an aid in the diagnosis of influenza from Nasopharyngeal swab specimens and should not be used as a sole basis for treatment. Nasal washings and aspirates are unacceptable for Xpert Xpress SARS-CoV-2/FLU/RSV testing.  Fact Sheet for Patients: BloggerCourse.com  Fact Sheet for Healthcare Providers: SeriousBroker.it  This test is not yet approved or cleared by the [place] FDA  and has been authorized for detection and/or diagnosis of SARS-CoV-2 by FDA under an Emergency Use Authorization (EUA). This EUA will remain in effect (meaning this test can be used) for the duration of the COVID-19 declaration under Section 564(b)(1) of the Act, 21 U.S.C. section 360bbb-3(b)(1), unless the authorization is terminated or revoked.  Performed at [facility], [address], [place], [place] [address]    WBC 07/12/2021 8.6  4.0 - 10.5 K/uL Final   RBC 07/12/2021 4.93  3.87 - 5.11 MIL/uL Final   Hemoglobin 07/12/2021 14.1  12.0 - 15.0 g/dL Final   HCT 61/95/0932 43.4  36.0 - 46.0 % Final   MCV 07/12/2021 88.0  80.0 - 100.0 fL Final   MCH 07/12/2021 28.6  26.0 - 34.0 pg Final   MCHC 07/12/2021 32.5  30.0 - 36.0 g/dL Final   RDW 67/08/4579 13.3  11.5 - 15.5 % Final   Platelets 07/12/2021 245  150 - 400 K/uL Final   nRBC 07/12/2021 0.0  0.0 - 0.2 % Final   Neutrophils Relative % 07/12/2021 58  % Final   Neutro Abs 07/12/2021 4.9  1.7 - 7.7 K/uL Final   Lymphocytes Relative 07/12/2021 36  % Final   Lymphs Abs 07/12/2021 3.1  0.7 - 4.0 K/uL Final   Monocytes Relative 07/12/2021 4  % Final   Monocytes Absolute 07/12/2021 0.4  0.1 - 1.0 K/uL Final   Eosinophils Relative 07/12/2021 1  % Final   Eosinophils Absolute 07/12/2021 0.1  0.0 - 0.5 K/uL Final   Basophils Relative 07/12/2021 1  % Final   Basophils Absolute 07/12/2021 0.1  0.0 - 0.1 K/uL Final   Immature Granulocytes 07/12/2021 0  % Final   Abs Immature Granulocytes 07/12/2021 0.02  0.00 - 0.07 K/uL Final   Performed at [facility], [address], [place], [place] [address]   Sodium 07/12/2021 138  135 - 145 mmol/L Final   Potassium 07/12/2021 4.6  3.5 - 5.1 mmol/L Final   Chloride 07/12/2021 104  98 - 111 mmol/L Final   CO2 07/12/2021 24  22 - 32 mmol/L Final   Glucose, Bld 07/12/2021 78  70 - 99 mg/dL Final   Glucose reference range applies only to samples taken after fasting for at least 8 hours.    BUN 07/12/2021 7  6 - 20 mg/dL Final   Creatinine, Ser 07/12/2021 0.86  0.44 - 1.00 mg/dL Final   Calcium 82/50/5397 9.4  8.9 - 10.3 mg/dL Final   Total  Protein 07/12/2021 7.2  6.5 - 8.1 g/dL Final   Albumin 70/35/0093 3.9  3.5 - 5.0 g/dL Final   AST 81/82/9937 14 (A) 15 - 41 U/L Final   ALT 07/12/2021 14  0 - 44 U/L Final   Alkaline Phosphatase 07/12/2021 72  38 - 126 U/L Final   Total Bilirubin 07/12/2021 0.3  0.3 - 1.2 mg/dL Final   GFR, Estimated 07/12/2021 >60  >60 mL/min Final   Comment: (NOTE) Calculated using the CKD-EPI Creatinine Equation (2021)    Anion gap 07/12/2021 10  5 - 15 Final   Performed at [facility], [address] [place], [place] [address]   Hgb A1c MFr Bld 07/12/2021 5.4  4.8 - 5.6 % Final   Comment: (NOTE) Pre diabetes:          5.7%-6.4%  Diabetes:              >6.4%  Glycemic control for   <7.0% adults with diabetes    Mean Plasma Glucose 07/12/2021 108.28  mg/dL Final   Performed at [facility], [address] [place], [place] [address]   TSH 07/12/2021 2.548  0.350 - 4.500 uIU/mL Final   Comment: Performed by a 3rd Generation assay with a functional sensitivity of <=0.01 uIU/mL. Performed at [facility], [address] [place], [place] [address]    POC Amphetamine UR 07/12/2021 None Detected  NONE DETECTED (Cut Off Level 1000 ng/mL) Final   POC Secobarbital (BAR) 07/12/2021 None Detected  NONE DETECTED (Cut Off Level 300 ng/mL) Final   POC Buprenorphine (BUP) 07/12/2021 None Detected  NONE DETECTED (Cut Off Level 10 ng/mL) Final   POC Oxazepam (BZO) 07/12/2021 None Detected  NONE DETECTED (Cut Off Level 300 ng/mL) Final   POC Cocaine UR 07/12/2021 None Detected  NONE DETECTED (Cut Off Level 300 ng/mL) Final   POC Methamphetamine UR 07/12/2021 None Detected  NONE DETECTED (Cut Off Level 1000 ng/mL) Final   POC Morphine 07/12/2021 None Detected  NONE DETECTED (Cut Off Level 300 ng/mL) Final   POC Oxycodone UR 07/12/2021 None  Detected  NONE DETECTED (Cut Off Level 100 ng/mL) Final   POC Methadone UR 07/12/2021 None Detected  NONE DETECTED (Cut Off Level 300 ng/mL) Final   POC Marijuana UR 07/12/2021 None Detected  NONE DETECTED (Cut Off Level 50 ng/mL) Final   SARSCOV2ONAVIRUS 2 AG 07/12/2021 NEGATIVE  NEGATIVE Final   Comment: (NOTE) SARS-CoV-2 antigen NOT DETECTED.   Negative results are presumptive.  Negative results do not preclude SARS-CoV-2 infection and should not be used as the sole basis for treatment or other patient management decisions, including infection  control decisions, particularly in the presence of clinical signs and  symptoms consistent with COVID-19, or in those who have been in contact with the virus.  Negative results must be combined with clinical observations, patient history, and epidemiological information. The expected result is Negative.  Fact Sheet for Patients: https://www.jennings-kim.com/  Fact Sheet for Healthcare Providers: https://alexander-rogers.biz/  This test is not yet approved or cleared by the [place] FDA and  has been authorized for detection and/or diagnosis of SARS-CoV-2 by FDA under an Emergency Use Authorization (EUA).  This EUA will remain in effect (meaning this test can be used) for the duration of  the COV                          ID-19 declaration under Section 564(b)(1) of  the Act, 21 U.S.C. section 360bbb-3(b)(1), unless the authorization is terminated or revoked sooner.     Preg Test, Ur 07/12/2021 NEGATIVE  NEGATIVE Final   Comment:        THE SENSITIVITY OF THIS METHODOLOGY IS >24 mIU/mL    Cholesterol 07/12/2021 196  0 - 200 mg/dL Final   Triglycerides 16/06/9603 121  <150 mg/dL Final   HDL 54/05/8118 52  >40 mg/dL Final   Total CHOL/HDL Ratio 07/12/2021 3.8  RATIO Final   VLDL 07/12/2021 24  0 - 40 mg/dL Final   LDL Cholesterol 07/12/2021 120 (A) 0 - 99 mg/dL Final   Comment:        Total  Cholesterol/HDL:CHD Risk Coronary Heart Disease Risk Table                     Men   Women  1/2 Average Risk   3.4   3.3  Average Risk       5.0   4.4  2 X Average Risk   9.6   7.1  3 X Average Risk  23.4   11.0        Use the calculated Patient Ratio above and the CHD Risk Table to determine the patient's CHD Risk.        ATP III CLASSIFICATION (LDL):  <100     mg/dL   Optimal  147-829  mg/dL   Near or Above                    Optimal  130-159  mg/dL   Borderline  562-130  mg/dL   High  >865     mg/dL   Very High Performed at [facility], [address], [place], [place] [address]   Admission on 03/22/2021, Discharged on 03/22/2021  Component Date Value Ref Range Status   I-stat hCG, quantitative 03/22/2021 <5.0  <5 mIU/mL Final   Comment 3 03/22/2021          Final   Comment:   GEST. AGE      CONC.  (mIU/mL)   <=1 WEEK        5 - 50     2 WEEKS       50 - 500     3 WEEKS       100 - 10,000     4 WEEKS     1,000 - 30,000        FEMALE AND NON-PREGNANT FEMALE:     LESS THAN 5 mIU/mL    WBC 03/22/2021 8.9  4.0 - 10.5 K/uL Final   RBC 03/22/2021 4.54  3.87 - 5.11 MIL/uL Final   Hemoglobin 03/22/2021 13.0  12.0 - 15.0 g/dL Final   HCT 62/95/2841 39.8  36.0 - 46.0 % Final   MCV 03/22/2021 87.7  80.0 - 100.0 fL Final   MCH 03/22/2021 28.6  26.0 - 34.0 pg Final   MCHC 03/22/2021 32.7  30.0 - 36.0 g/dL Final   RDW 32/44/0102 13.4  11.5 - 15.5 % Final   Platelets 03/22/2021 224  150 - 400 K/uL Final   nRBC 03/22/2021 0.0  0.0 - 0.2 % Final   Neutrophils Relative % 03/22/2021 70  % Final   Neutro Abs 03/22/2021 6.2  1.7 - 7.7 K/uL Final   Lymphocytes Relative 03/22/2021 23  % Final   Lymphs Abs 03/22/2021 2.1  0.7 - 4.0 K/uL Final   Monocytes Relative 03/22/2021 5  %  Final   Monocytes Absolute 03/22/2021 0.4  0.1 - 1.0 K/uL Final   Eosinophils Relative 03/22/2021 2  % Final   Eosinophils Absolute 03/22/2021 0.1  0.0 - 0.5 K/uL Final   Basophils Relative 03/22/2021 0  %  Final   Basophils Absolute 03/22/2021 0.0  0.0 - 0.1 K/uL Final   Immature Granulocytes 03/22/2021 0  % Final   Abs Immature Granulocytes 03/22/2021 0.03  0.00 - 0.07 K/uL Final   Performed at [facility], [address] [place], [place] [address]   Sodium 03/22/2021 137  135 - 145 mmol/L Final   Potassium 03/22/2021 3.9  3.5 - 5.1 mmol/L Final   Chloride 03/22/2021 106  98 - 111 mmol/L Final   CO2 03/22/2021 23  22 - 32 mmol/L Final   Glucose, Bld 03/22/2021 130 (A) 70 - 99 mg/dL Final   Glucose reference range applies only to samples taken after fasting for at least 8 hours.   BUN 03/22/2021 9  6 - 20 mg/dL Final   Creatinine, Ser 03/22/2021 0.83  0.44 - 1.00 mg/dL Final   Calcium 78/46/9629 8.9  8.9 - 10.3 mg/dL Final   Total Protein 52/84/1324 6.8  6.5 - 8.1 g/dL Final   Albumin 40/06/2724 3.4 (A) 3.5 - 5.0 g/dL Final   AST 36/64/4034 21  15 - 41 U/L Final   ALT 03/22/2021 21  0 - 44 U/L Final   Alkaline Phosphatase 03/22/2021 57  38 - 126 U/L Final   Total Bilirubin 03/22/2021 0.2 (A) 0.3 - 1.2 mg/dL Final   GFR, Estimated 03/22/2021 >60  >60 mL/min Final   Comment: (NOTE) Calculated using the CKD-EPI Creatinine Equation (2021)    Anion gap 03/22/2021 8  5 - 15 Final   Performed at [facility], [address], [place], [place] [address]   D-Dimer, Quant 03/22/2021 0.62 (A) 0.00 - 0.50 ug/mL-FEU Final   Comment: (NOTE) At the manufacturer cut-off value of 0.5 g/mL FEU, this assay has a negative predictive value of 95-100%.This assay is intended for use in conjunction with a clinical pretest probability (PTP) assessment model to exclude pulmonary embolism (PE) and deep venous thrombosis (DVT) in outpatients suspected of PE or DVT. Results should be correlated with clinical presentation. Performed at [facility], [address] [place], [place] [address]    Troponin I (High Sensitivity) 03/22/2021 3  <18 ng/L Final   Comment: (NOTE) Elevated high  sensitivity troponin I (hsTnI) values and significant  changes across serial measurements may suggest ACS but many other  chronic and acute conditions are known to elevate hsTnI results.  Refer to the "Links" section for chest pain algorithms and additional  guidance. Performed at [facility], [address], [place], [place] [address]    SARS Coronavirus 2 by RT PCR 03/22/2021 NEGATIVE  NEGATIVE Final   Comment: (NOTE) SARS-CoV-2 target nucleic acids are NOT DETECTED.  The SARS-CoV-2 RNA is generally detectable in upper respiratory specimens during the acute phase of infection. The lowest concentration of SARS-CoV-2 viral copies this assay can detect is 138 copies/mL. A negative result does not preclude SARS-Cov-2 infection and should not be used as the sole basis for treatment or other patient management decisions. A negative result may occur with  improper specimen collection/handling, submission of specimen other than nasopharyngeal swab, presence of viral mutation(s) within the areas targeted by this assay, and inadequate number of viral copies(<138 copies/mL). A negative result must be combined  with clinical observations, patient history, and epidemiological information. The expected result is Negative.  Fact Sheet for Patients:  BloggerCourse.com  Fact Sheet for Healthcare Providers:  SeriousBroker.it  This test is no                          t yet approved or cleared by the [place] FDA and  has been authorized for detection and/or diagnosis of SARS-CoV-2 by FDA under an Emergency Use Authorization (EUA). This EUA will remain  in effect (meaning this test can be used) for the duration of the COVID-19 declaration under Section 564(b)(1) of the Act, 21 U.S.C.section 360bbb-3(b)(1), unless the authorization is terminated  or revoked sooner.       Influenza A by PCR 03/22/2021 NEGATIVE  NEGATIVE Final    Influenza B by PCR 03/22/2021 NEGATIVE  NEGATIVE Final   Comment: (NOTE) The Xpert Xpress SARS-CoV-2/FLU/RSV plus assay is intended as an aid in the diagnosis of influenza from Nasopharyngeal swab specimens and should not be used as a sole basis for treatment. Nasal washings and aspirates are unacceptable for Xpert Xpress SARS-CoV-2/FLU/RSV testing.  Fact Sheet for Patients: BloggerCourse.com  Fact Sheet for Healthcare Providers: SeriousBroker.it  This test is not yet approved or cleared by the [place] FDA and has been authorized for detection and/or diagnosis of SARS-CoV-2 by FDA under an Emergency Use Authorization (EUA). This EUA will remain in effect (meaning this test can be used) for the duration of the COVID-19 declaration under Section 564(b)(1) of the Act, 21 U.S.C. section 360bbb-3(b)(1), unless the authorization is terminated or revoked.  Performed at [facility] [facility], [address] [place], [place] [address]     Allergies: Patient has no known allergies.  PTA Medications: (Not in a hospital admission)   Medical Decision Making  JYA HUGHSTON is a 20 y.o. single female with a history of schizoaffective disorder, bipolar type and intellectual disability presenting to [facility] for auditory and visual hallucinations. Chart reviewed and labs reviwed and based on my assessment and TTS assessment patient meets criteria for continuous assessment at [facility].     Recommendations  Based on my evaluation the patient does not appear to have an emergency medical condition. Patient will be admitted to [facility] for continuous assessment for safety and crisis management.   Jasper Riling, NP 07/13/21  8:59 PM

## 2021-07-13 NOTE — ED Notes (Signed)
Pt resting at present, no distress noted, calm & cooperative.  Monitoring for safety. 

## 2021-07-13 NOTE — ED Provider Notes (Signed)
FBC/OBS ASAP Discharge Summary  Date and Time: 07/13/2021 4:26 PM  Name: Tracy Bond  MRN:  [id number]   Discharge Diagnoses:  Final diagnoses:  Schizoaffective disorder, bipolar type [facility])    Subjective:  Tracy Bond was seen and evaluated face-to-face.  Denying suicidal or homicidal ideations.  Denies auditory or visual hallucinations currently.  Chart review medications was adjusted.  Abilify was increased from 15 mg to 20 mg.  She reports a follow-up appointment on 07/16/2021 with her primary psychiatric provider.  NP spoke to patient's mother Tracy Bond who denied any safety concerns with patient returning home.  Case staffed with attending psychiatrist MD Akintayo.  support,encouragement and reassurance was provided.  Stay Summary:  per admission assessment note: "Tracy Bond is a 20 y.o. single female with a history of schizoaffective disorder, bipolar type and intellectual disability presenting voluntarily with her mother Tracy Bond with complaint of auditory and visual hallucinations that have been worsening for several weeks. Mother states that she feels that patient's medications need to be adjusted. Mother reports that patient is taking abilify, celexa, buspar and amantadine but was not sure of the dosages of the medications. Patient lives at home with her mother and older sister. Patient's mother reports that patient has been sad, angry, with verbal outburst of yelling, decreased concentration/focus low energy, racing thoughts, and with difficulty getting to sleep and falling asleep. Patient and mother deny any physical aggression by patient. Patient reports that she feels supported by her mother, father and other family members. Patient's mother reports that patient is being followed by Tracy Bond at [facility] and has an upcoming appointment this week. Patient's mother reports that she feels patient need a day treatment program for patient to attend during the day  when her mother is at work. Mother reports that she plans to pursue legal guardianship of patient in the near future. Patient presents as calm and cooperative during the assessment and wants is willing to come [facility] for medication adjustment. Patient denies any SI or HI."   Total Time spent with patient: 15 minutes  Past Psychiatric History:  Past Medical History:  Past Medical History:  Diagnosis Date   Asthma    Medical history non-contributory    Psychosis (HCC) 03/19/2017   No past surgical history on file. Family History:  Family History  Problem Relation Age of Onset   Mental illness Neg Hx    Family Psychiatric History:  Social History:  Social History   Substance and Sexual Activity  Alcohol Use No   Comment: Pt denied; BAC not available at time of assessment     Social History   Substance and Sexual Activity  Drug Use No   Comment: Pt denied; UDS not available at time of assessment    Social History   Socioeconomic History   Marital status: Single    Spouse name: Not on file   Number of children: Not on file   Years of education: Not on file   Highest education level: Not on file  Occupational History   Not on file  Tobacco Use   Smoking status: Never   Smokeless tobacco: Never  Substance and Sexual Activity   Alcohol use: No    Comment: Pt denied; BAC not available at time of assessment   Drug use: No    Comment: Pt denied; UDS not available at time of assessment   Sexual activity: Never  Other Topics Concern   Not on file  Social History Narrative  Not on file   Social Determinants of Health   Financial Resource Strain: Not on file  Food Insecurity: Not on file  Transportation Needs: Not on file  Physical Activity: Not on file  Stress: Not on file  Social Connections: Not on file   SDOH:  SDOH Screenings   Alcohol Screen: Low Risk    Last Alcohol Screening Score (AUDIT): 0  Depression (PHQ2-9): Not on file  Financial Resource Strain:  Not on file  Food Insecurity: Not on file  Housing: Not on file  Physical Activity: Not on file  Social Connections: Not on file  Stress: Not on file  Tobacco Use: Low Risk    Smoking Tobacco Use: Never   Smokeless Tobacco Use: Never  Transportation Needs: Not on file    Tobacco Cessation:  A prescription for an FDA-approved tobacco cessation medication provided at discharge  Current Medications:  Current Facility-Administered Medications  Medication Dose Route Frequency Provider Last Rate Last Admin   acetaminophen (TYLENOL) tablet 650 mg  650 mg Oral Q6H PRN Ajibola, [name] A, NP       alum & mag hydroxide-simeth (MAALOX/MYLANTA) 200-200-20 MG/5ML suspension 30 mL  30 mL Oral Q4H PRN Ajibola, [name] A, NP       amantadine (SYMMETREL) capsule 100 mg  100 mg Oral QHS Ajibola, [name] A, NP   100 mg at 07/13/21 0205   ARIPiprazole (ABILIFY) tablet 20 mg  20 mg Oral QHS Ajibola, [name] A, NP   20 mg at 07/13/21 5885   busPIRone (BUSPAR) tablet 10 mg  10 mg Oral TID Ajibola, [name] A, NP   10 mg at 07/13/21 0931   citalopram (CELEXA) tablet 20 mg  20 mg Oral QHS Ajibola, [name] A, NP   20 mg at 07/13/21 0205   magnesium hydroxide (MILK OF MAGNESIA) suspension 30 mL  30 mL Oral Daily PRN Ajibola, [name] A, NP       Current Outpatient Medications  Medication Sig Dispense Refill   ARIPiprazole (ABILIFY) 20 MG tablet Take 20 mg by mouth daily.     albuterol (PROVENTIL HFA;VENTOLIN HFA) 108 (90 Base) MCG/ACT inhaler Inhale 2 puffs into the lungs every 4 (four) hours as needed for wheezing or shortness of breath. 1 Inhaler 3   amantadine (SYMMETREL) 100 MG capsule Take 1 capsule (100 mg total) by mouth 2 (two) times daily. 7 capsule 0   ARIPiprazole (ABILIFY) 20 MG tablet Take 1 tablet (20 mg total) by mouth at bedtime. 30 tablet 0   busPIRone (BUSPAR) 10 MG tablet Take 1 tablet (10 mg total) by mouth 3 (three) times daily. 21 tablet 0   cetirizine (ZYRTEC) 10 MG tablet Take 10 mg by mouth daily as needed for  allergies.     citalopram (CELEXA) 20 MG tablet Take 1 tablet (20 mg total) by mouth at bedtime. 7 tablet 0   hydrOXYzine (ATARAX/VISTARIL) 25 MG tablet Take 1 tablet (25 mg total) by mouth 3 (three) times daily as needed for anxiety. 21 tablet 0    PTA Medications: (Not in a hospital admission)   Musculoskeletal  Strength & Muscle Tone: within normal limits Gait & Station: normal Patient leans: N/A  Psychiatric Specialty Exam  Presentation  General Appearance: Appropriate for Environment  Eye Contact:Good  Speech:Clear and Coherent  Speech Volume:Normal  Handedness:Right   Mood and Affect  Mood:Anxious; Depressed  Affect:Congruent   Thought Process  Thought Processes:Coherent  Descriptions of Associations:Intact  Orientation:Full (Time, Place and Person)  Thought Content:Logical  Diagnosis of Schizophrenia or Schizoaffective disorder in past: No    Hallucinations:Hallucinations: Auditory Description of Auditory Hallucinations: haering voices  Ideas of Reference:None  Suicidal Thoughts:Suicidal Thoughts: No  Homicidal Thoughts:Homicidal Thoughts: No   Sensorium  Memory:Recent Good; Remote Good; Immediate Good  Judgment:Fair  Insight:Fair   Executive Functions  Concentration:Fair  Attention Span:Fair  Recall:Good  Fund of Knowledge:Good  Language:Good   Psychomotor Activity  Psychomotor Activity:Psychomotor Activity: Normal   Assets  Assets:Desire for Improvement; Social Support   Sleep  Sleep:Sleep: Fair Number of Hours of Sleep: -1   Nutritional Assessment (For OBS and FBC admissions only) Has the patient had a weight loss or gain of 10 pounds or more in the last 3 months?: No Has the patient had a decrease in food intake/or appetite?: No Does the patient have dental problems?: No Does the patient have eating habits or behaviors that may be indicators of an eating disorder including binging or inducing vomiting?: No Has the  patient recently lost weight without trying?: 0 Has the patient been eating poorly because of a decreased appetite?: 0 Malnutrition Screening Tool Score: 0   Physical Exam  Physical Exam Vitals and nursing note reviewed.  Cardiovascular:     Rate and Rhythm: Normal rate and regular rhythm.  Pulmonary:     Effort: Pulmonary effort is normal.  Neurological:     General: No focal deficit present.     Mental Status: She is alert.  Psychiatric:        Attention and Perception: Attention normal.        Mood and Affect: Mood normal.        Speech: Speech normal.        Behavior: Behavior normal.        Thought Content: Thought content normal.        Cognition and Memory: Cognition normal.        Judgment: Judgment normal.   Review of Systems  Cardiovascular: Negative.   Genitourinary: Negative.   Skin: Negative.   Psychiatric/Behavioral:  Positive for hallucinations. Negative for depression and suicidal ideas. The patient is nervous/anxious.   All other systems reviewed and are negative. Blood pressure 115/64, pulse 82, temperature 98.3 F (36.8 C), temperature source Oral, resp. rate 16, SpO2 97 %. There is no height or weight on file to calculate BMI.  Demographic Factors:  Adolescent or young adult  Loss Factors: Financial problems/change in socioeconomic status  Historical Factors: NA  Risk Reduction Factors:   Positive social support, Positive therapeutic relationship, and Positive coping skills or problem solving skills  Continued Clinical Symptoms:  Depression:   Severe  Cognitive Features That Contribute To Risk:  Closed-mindedness    Suicide Risk:  Minimal: No identifiable suicidal ideation.  Patients presenting with no risk factors but with morbid ruminations; may be classified as minimal risk based on the severity of the depressive symptoms  Plan Of Care/Follow-up recommendations:  Activity:  as tolerated  Diet:  heart healty   Disposition: Take all  medications as prescribed. Keep all follow-up appointments as scheduled.  Do not consume alcohol or use illegal drugs while on prescription medications. Report any adverse effects from your medications to your primary care provider promptly.  In the event of recurrent symptoms or worsening symptoms, call 911, a crisis hotline, or go to the nearest emergency department for evaluation.    Oneta Rack, NP 07/13/2021, 4:26 PM

## 2021-07-14 NOTE — Discharge Instructions (Addendum)
Take all medications as prescribed. Keep all follow-up appointments as scheduled.  Do not consume alcohol or use illegal drugs while on prescription medications. Report any adverse effects from your medications to your primary care provider promptly.  In the event of recurrent symptoms or worsening symptoms, call 911, a crisis hotline, or go to the nearest emergency department for evaluation.   

## 2021-07-14 NOTE — ED Notes (Signed)
Pt sleeping at present, no distress noted.  Monitoring for safety. 

## 2021-07-14 NOTE — ED Provider Notes (Signed)
FBC/OBS ASAP Discharge Summary  Date and Time: 07/14/2021 2:45 PM  Name: Tracy Bond  MRN:  [id number]   Discharge Diagnoses:  Final diagnoses:  Schizoaffective disorder, bipolar type [facility])    Subjective:  Tracy Bond was seen and evaluated today, patient is denying suicidal or homicidal ideations.  Denies auditory or visual hallucinations.  Patient presents slightly irritable but did not elaborate on the reasons why she returned back.  Patient was seen by this provider on 07/13/2021.  NP spoke to patient's mother Tracy Bond who reports " Tracy Bond was arguing with her sister and being disrespectful."  Stated that they were asked to leave the sisters apartment and now she doesn't have anywhere to go.    Tracy Bond stated  " know my child, and she is using this as manipulation."  Mother reports a verbal altercation between she and her sister and stated Tracy Bond behavior escalated.  States she was asked to leave the apartment due to Tracy Bond behavior. Reported Tracy Bond was yelling and screaming. Then she started acting like " she was hearing voices."  Mother is requesting for patient to be discharged states she is going to reside at her father's home.  States she has a follow-up appointment with her primary care provider this Thursday and does not feel that inpatient admission is warranted.    -Mother also shared that she does not actually have guardianship however stated she is working on it.  Denied concerns for patient's safety.  Medication was adjusted on 07/12/2021.  No safety concerns.   Stay Summary:  Total Time spent with patient: 15 minutes  Past Psychiatric History:  Past Medical History:  Past Medical History:  Diagnosis Date   Asthma    Medical history non-contributory    Psychosis (HCC) 03/19/2017   No past surgical history on file. Family History:  Family History  Problem Relation Age of Onset   Mental illness Neg Hx    Family Psychiatric History:  Social History:  Social History    Substance and Sexual Activity  Alcohol Use No   Comment: Pt denied; BAC not available at time of assessment     Social History   Substance and Sexual Activity  Drug Use No   Comment: Pt denied; UDS not available at time of assessment    Social History   Socioeconomic History   Marital status: Single    Spouse name: Not on file   Number of children: Not on file   Years of education: Not on file   Highest education level: Not on file  Occupational History   Not on file  Tobacco Use   Smoking status: Never   Smokeless tobacco: Never  Substance and Sexual Activity   Alcohol use: No    Comment: Pt denied; BAC not available at time of assessment   Drug use: No    Comment: Pt denied; UDS not available at time of assessment   Sexual activity: Never  Other Topics Concern   Not on file  Social History Narrative   Not on file   Social Determinants of Health   Financial Resource Strain: Not on file  Food Insecurity: Not on file  Transportation Needs: Not on file  Physical Activity: Not on file  Stress: Not on file  Social Connections: Not on file   SDOH:  SDOH Screenings   Alcohol Screen: Low Risk    Last Alcohol Screening Score (AUDIT): 0  Depression (PHQ2-9): Not on file  Financial Resource Strain: Not on file  Food  Insecurity: Not on file  Housing: Not on file  Physical Activity: Not on file  Social Connections: Not on file  Stress: Not on file  Tobacco Use: Low Risk    Smoking Tobacco Use: Never   Smokeless Tobacco Use: Never  Transportation Needs: Not on file    Tobacco Cessation:  N/A, patient does not currently use tobacco products  Current Medications:  Current Facility-Administered Medications  Medication Dose Route Frequency Provider Last Rate Last Admin   acetaminophen (TYLENOL) tablet 650 mg  650 mg Oral Q6H PRN Bobbitt, Shalon E, NP       albuterol (VENTOLIN HFA) 108 (90 Base) MCG/ACT inhaler 2 puff  2 puff Inhalation Q4H PRN Bobbitt, Shalon E,  NP       alum & mag hydroxide-simeth (MAALOX/MYLANTA) 200-200-20 MG/5ML suspension 30 mL  30 mL Oral Q4H PRN Bobbitt, Shalon E, NP       amantadine (SYMMETREL) capsule 100 mg  100 mg Oral BID Bobbitt, Shalon E, NP   100 mg at 07/14/21 0938   ARIPiprazole (ABILIFY) tablet 20 mg  20 mg Oral QHS Bobbitt, Shalon E, NP   20 mg at 07/13/21 2218   busPIRone (BUSPAR) tablet 10 mg  10 mg Oral TID Bobbitt, Shalon E, NP   10 mg at 07/14/21 0939   citalopram (CELEXA) tablet 20 mg  20 mg Oral QHS Bobbitt, Shalon E, NP   20 mg at 07/13/21 2218   hydrOXYzine (ATARAX/VISTARIL) tablet 25 mg  25 mg Oral TID PRN Bobbitt, Shalon E, NP   25 mg at 07/13/21 2217   loratadine (CLARITIN) tablet 10 mg  10 mg Oral Daily Bobbitt, Shalon E, NP   10 mg at 07/14/21 4132   magnesium hydroxide (MILK OF MAGNESIA) suspension 30 mL  30 mL Oral Daily PRN Bobbitt, Shalon E, NP       traZODone (DESYREL) tablet 50 mg  50 mg Oral QHS PRN Bobbitt, Shalon E, NP   50 mg at 07/13/21 2217   Current Outpatient Medications  Medication Sig Dispense Refill   albuterol (PROVENTIL HFA;VENTOLIN HFA) 108 (90 Base) MCG/ACT inhaler Inhale 2 puffs into the lungs every 4 (four) hours as needed for wheezing or shortness of breath. 1 Inhaler 3   amantadine (SYMMETREL) 100 MG capsule Take 1 capsule (100 mg total) by mouth 2 (two) times daily. 7 capsule 0   ARIPiprazole (ABILIFY) 20 MG tablet Take 1 tablet (20 mg total) by mouth at bedtime. 30 tablet 0   ARIPiprazole (ABILIFY) 20 MG tablet Take 20 mg by mouth daily.     busPIRone (BUSPAR) 10 MG tablet Take 1 tablet (10 mg total) by mouth 3 (three) times daily. 21 tablet 0   cetirizine (ZYRTEC) 10 MG tablet Take 10 mg by mouth daily as needed for allergies.     citalopram (CELEXA) 20 MG tablet Take 1 tablet (20 mg total) by mouth at bedtime. 7 tablet 0   hydrOXYzine (ATARAX/VISTARIL) 25 MG tablet Take 1 tablet (25 mg total) by mouth 3 (three) times daily as needed for anxiety. 21 tablet 0    PTA  Medications: (Not in a hospital admission)   Musculoskeletal  Strength & Muscle Tone: within normal limits Gait & Station: normal Patient leans: N/A  Psychiatric Specialty Exam  Presentation  General Appearance: Appropriate for Environment  Eye Contact:Fair  Speech:Clear and Coherent  Speech Volume:Normal  Handedness:Right   Mood and Affect  Mood:Anxious; Dysphoric  Affect:Flat; Blunt   Thought Process  Thought Processes:Coherent  Descriptions of Associations:Tangential  Orientation:Full (Time, Place and Person)  Thought Content:Tangential  Diagnosis of Schizophrenia or Schizoaffective disorder in past: Yes  Duration of Psychotic Symptoms: Greater than six months   Hallucinations:Hallucinations: Auditory; Visual Description of Auditory Hallucinations: hearing voices Description of Visual Hallucinations: unable to describe  Ideas of Reference:Paranoia  Suicidal Thoughts:Suicidal Thoughts: No  Homicidal Thoughts:Homicidal Thoughts: No   Sensorium  Memory:Immediate Fair; Recent Fair; Remote Fair  Judgment:Fair  Insight:Fair   Executive Functions  Concentration:Fair  Attention Span:Fair  Recall:Poor  Fund of Knowledge:Fair  Language:Fair   Psychomotor Activity  Psychomotor Activity:Psychomotor Activity: Normal   Assets  Assets:Desire for Improvement; Housing; Physical Health; Social Support   Sleep  Sleep:Sleep: Fair Number of Hours of Sleep: 6   Nutritional Assessment (For OBS and FBC admissions only) Has the patient had a weight loss or gain of 10 pounds or more in the last 3 months?: No Has the patient had a decrease in food intake/or appetite?: No Does the patient have dental problems?: No Does the patient have eating habits or behaviors that may be indicators of an eating disorder including binging or inducing vomiting?: No Has the patient recently lost weight without trying?: 0 Has the patient been eating poorly because of a  decreased appetite?: 0 Malnutrition Screening Tool Score: 0    Physical Exam  Physical Exam Vitals reviewed.  Neurological:     General: No focal deficit present.     Mental Status: She is alert.  Psychiatric:        Attention and Perception: Attention normal.        Mood and Affect: Mood is anxious.        Speech: Speech normal.        Behavior: Behavior normal. Behavior is cooperative.        Thought Content: Thought content normal. Thought content is not delusional. Thought content does not include suicidal ideation. Thought content does not include homicidal plan.        Cognition and Memory: Memory is impaired.        Judgment: Judgment normal.   Review of Systems  Eyes: Negative.   Cardiovascular: Negative.   Musculoskeletal: Negative.   Psychiatric/Behavioral:  Positive for depression. Negative for suicidal ideas. The patient is nervous/anxious.   All other systems reviewed and are negative. Blood pressure 108/71, pulse 83, temperature 97.6 F (36.4 C), temperature source Oral, resp. rate 16, SpO2 98 %. There is no height or weight on file to calculate BMI.  Demographic Factors:  Adolescent or young adult  Loss Factors: Financial problems/change in socioeconomic status  Historical Factors: Impulsivity  Risk Reduction Factors:   Living with another person, especially a relative and Positive social support  Continued Clinical Symptoms:  Severe Anxiety and/or Agitation  Cognitive Features That Contribute To Risk:  Closed-mindedness and Loss of executive function    Suicide Risk:  Minimal: No identifiable suicidal ideation.  Patients presenting with no risk factors but with morbid ruminations; may be classified as minimal risk based on the severity of the depressive symptoms  Plan Of Care/Follow-up recommendations:  Activity:  as tolerated Diet:  heart healthy  Disposition: Take all medications as prescribed. Keep all follow-up appointments as scheduled.  Do  not consume alcohol or use illegal drugs while on prescription medications. Report any adverse effects from your medications to your primary care provider promptly.  In the event of recurrent symptoms or worsening symptoms, call 911, a crisis hotline, or go to the nearest emergency department  for evaluation.    Oneta Rack, NP 07/14/2021, 2:45 PM

## 2021-07-14 NOTE — Progress Notes (Signed)
Per Tilford Pillar, patient meets criteria for inpatient treatment. There are no available or appropriate beds at [facility] today. CSW faxed referrals to the following facilities for review:  [facility]  Pending - No Request Sent N/A [address], [place] [address] [phone] --  [facility]  Pending - No Request Sent N/A [address] [place] [address] [phone] --  [facility]  Pending - No Request [address] [place] [address] [phone] --  [facility]  Pending - No Request Sent N/A [address] [place] [address] [phone] --  [facility]  Pending - No Request Sent N/A [address] [place] [address] [phone] --  [facility]  Pending - No Request Sent N/A [address] [place] [address] [phone] --  [facility]  Pending - No Request Sent N/A [address] [place], [place] [address] [phone] --  [facility]  Pending - No Request Sent N/A [address] [place] [address] [phone] --  [facility]  Pending - No Request Sent N/A [address], [place] [address] [phone] --  [facility]  Pending - No Request Sent N/A [address] [place] [address] [phone] --  [facility]  Pending - No Request Sent N/A [address] [place] [address] [phone] --  [facility]  Pending - No Request Sent N/A [address] [place] [address] [phone] --  [facility]  Pending - No Request Sent N/A [address] [place]e [place] [address] [phone] --  [facility]  Pending - No Request Sent N/A [address] [place] [address] [phone] --  [facility]  Pending - No Request Sent N/A [address] [place] [address] [phone] --  [facility]  Pending - No Request Sent N/A [address], [place] [address] [phone] --  [facility]  Pending - No Request Sent N/A [address], [place] [address] [phone] --    TTS will continue to seek bed placement.  Crissie Reese, MSW, LCSW-A, LCAS-A Phone: [phone] Disposition/TOC

## 2021-07-14 NOTE — ED Notes (Signed)
Patient A&O x 4, ambulatory. Patient discharged in no acute distress. Patient denied SI/HI, A/VH upon discharge. Patient verbalized understanding of all discharge instructions explained by staff, to include follow up appointments, RX's and safety plan. Patient reported mood 10/10.  Pt belongings returned to patient from locker intact. Patient escorted to lobby via staff for transport to destination via her mother. Safety maintained.

## 2021-07-14 NOTE — Progress Notes (Signed)
CSW received a call from Adel with [facility] who is requesting additional information to further review the patient for possible placement. Moreover, Raynelle Fanning is requesting labs: CBC,CMT,UA,BI, and pregnancy test results. CSW re-faxed referral with labs attached.   Crissie Reese, MSW, LCSW-A, LCAS-A Phone: [phone] Disposition/TOC

## 2021-07-14 NOTE — ED Notes (Signed)
Pt stated that she came back last night because she got into an argument with her sister and her mom told her she needed to come back here. Pt stated that her sister made her mad and kept telling her to be quiet while she was screaming at her. Safety maintained and will continue to monitor.

## 2021-07-17 ENCOUNTER — Telehealth (HOSPITAL_COMMUNITY): Payer: Self-pay | Admitting: Pediatrics

## 2021-07-17 NOTE — BH Assessment (Signed)
Care Management - Follow Up [facility] Discharges   Writer attempted to make contact with patient today and was unsuccessful.  Writer was not able to leave a HIPPA compliant voice message because the patient voice mail is not set up.

## 2021-08-21 IMAGING — CT CT ANGIO CHEST
2 of 10 series · 18 of 46 positions shown · IV contrast (omnipaque)
Comparison: Chest x-ray 03/22/2021

CLINICAL DATA: Leg swelling with positive D-dimer

EXAM:
CT ANGIOGRAPHY CHEST WITH CONTRAST
TECHNIQUE: Multidetector CT imaging of the chest was performed using the
standard protocol during bolus administration of intravenous
contrast. Multiplanar CT image reconstructions and MIPs were
obtained to evaluate the vascular anatomy.
CONTRAST:  75mL OMNIPAQUE IOHEXOL 350 MG/ML SOLN

[Series 13: thins · axial · 0.87mm/px · z∈[+972,+1171]mm · 15 of 322 slices shown]
[im 19/322  lung]
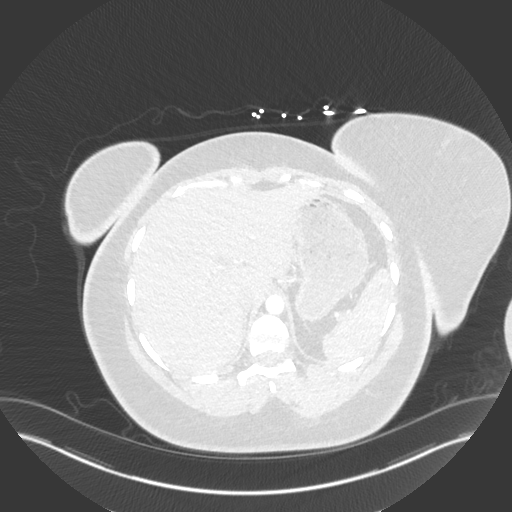
[im 38/322  soft-tissue]
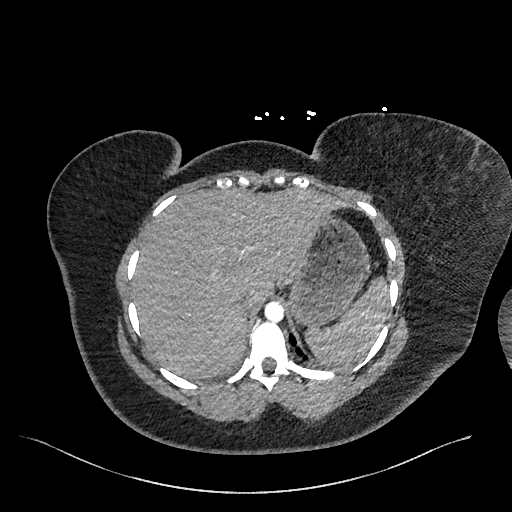
[im 57/322  lung]
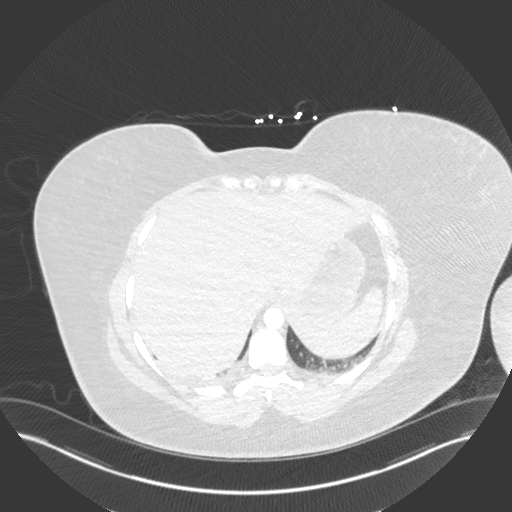
[im 76/322  soft-tissue]
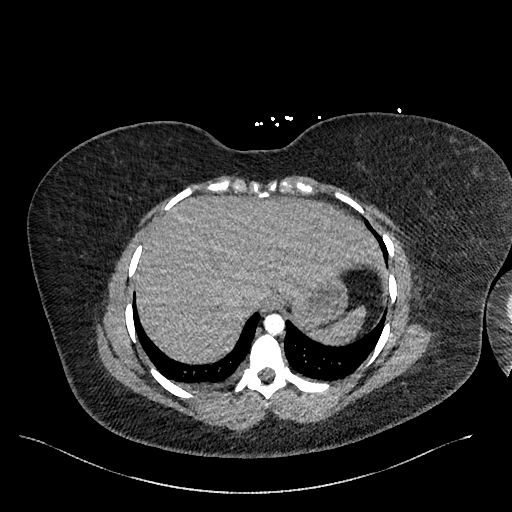
[im 95/322  lung]
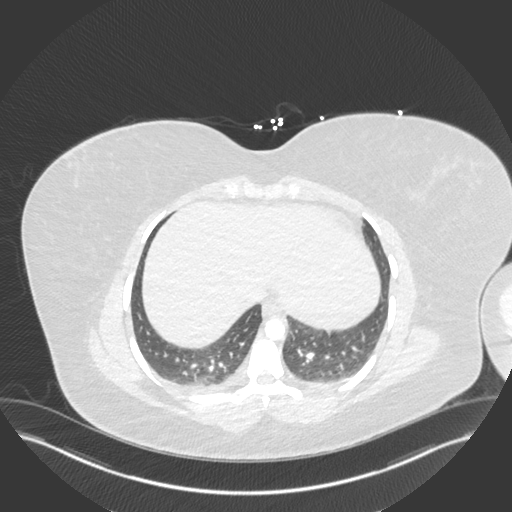
[im 114/322  soft-tissue]
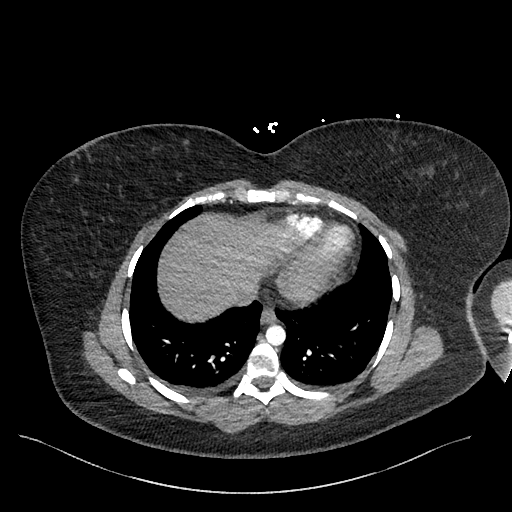
[im 133/322  lung]
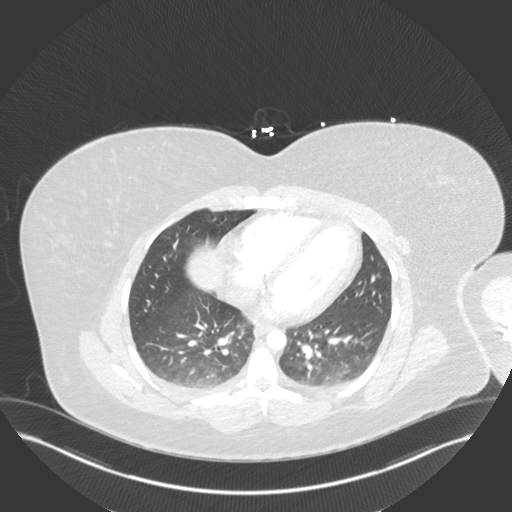
[im 170/322  soft-tissue]
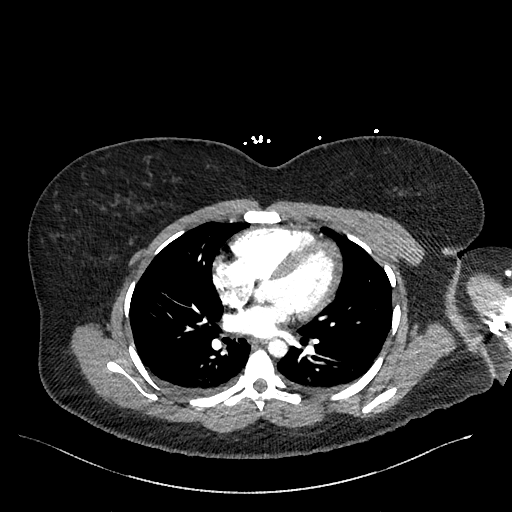
[im 189/322  lung]
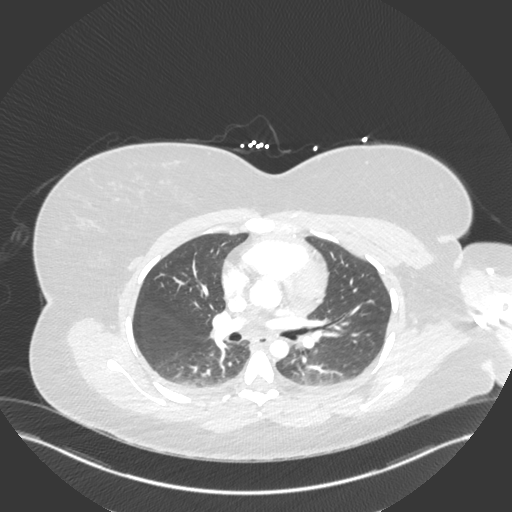
[im 208/322  soft-tissue]
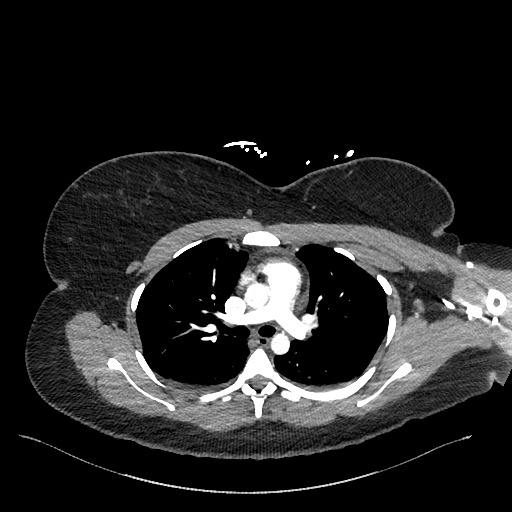
[im 227/322  lung]
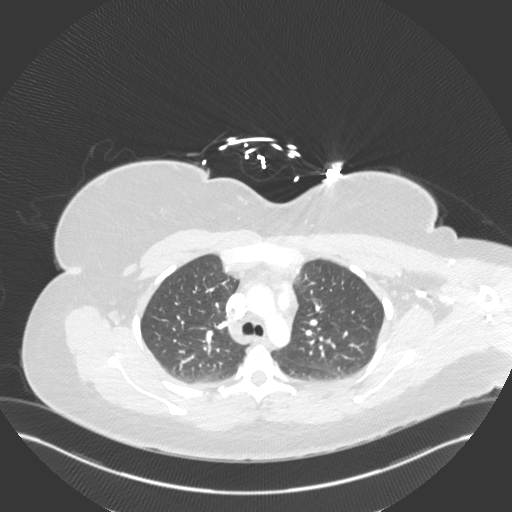
[im 246/322  soft-tissue]
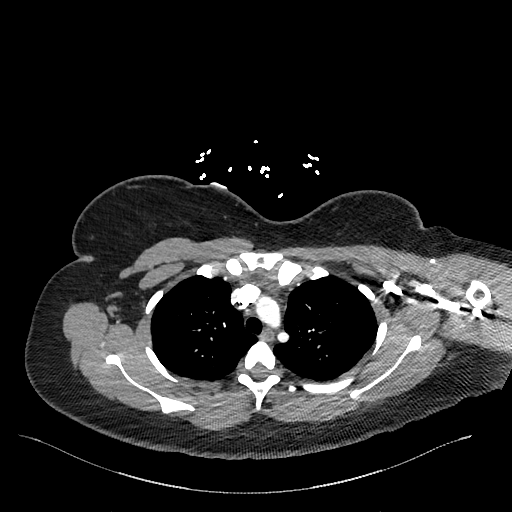
[im 265/322  lung]
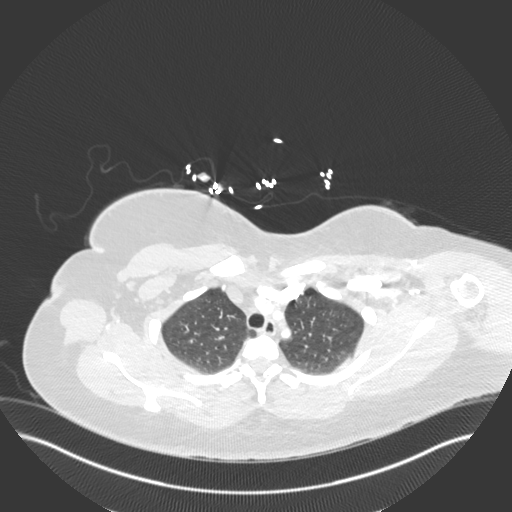
[im 284/322  soft-tissue]
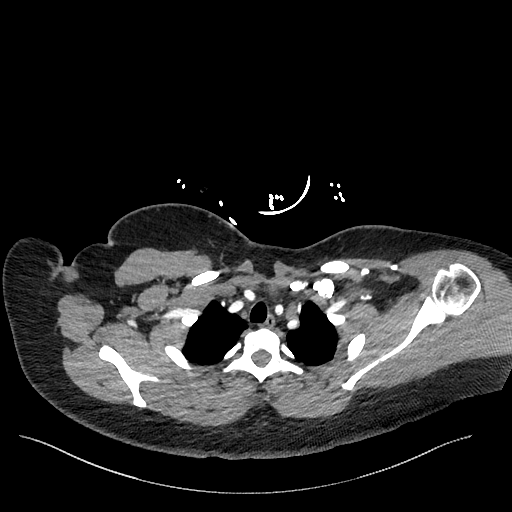
[im 303/322  lung]
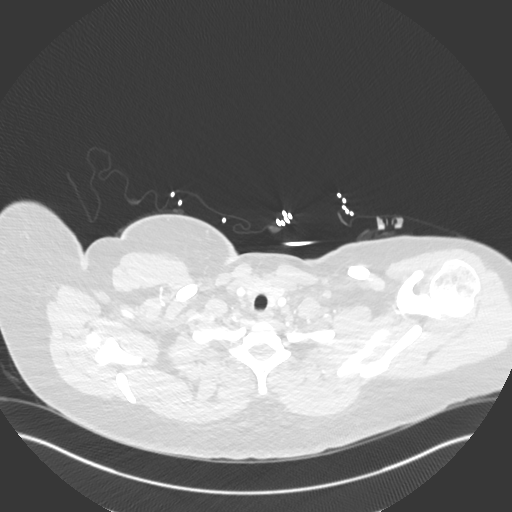

[Series 14: cor · coronal · 0.44mm/px · 3 of 153 slices shown]
[im 39/153  soft-tissue]
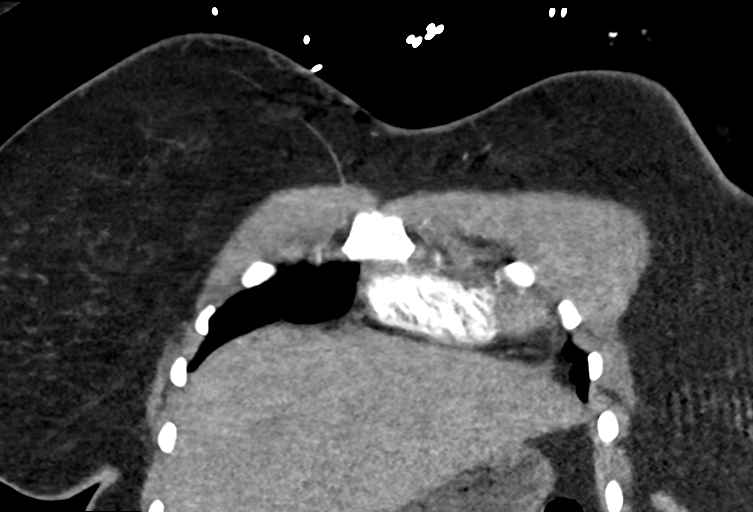
[im 77/153  soft-tissue]
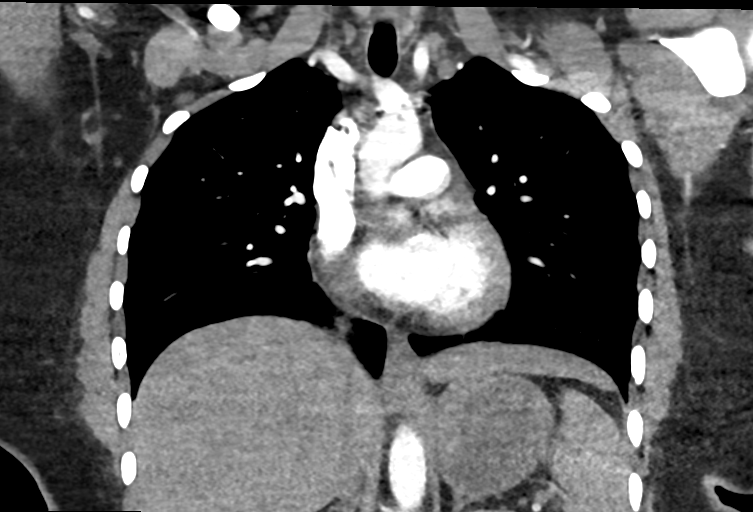
[im 115/153  soft-tissue]
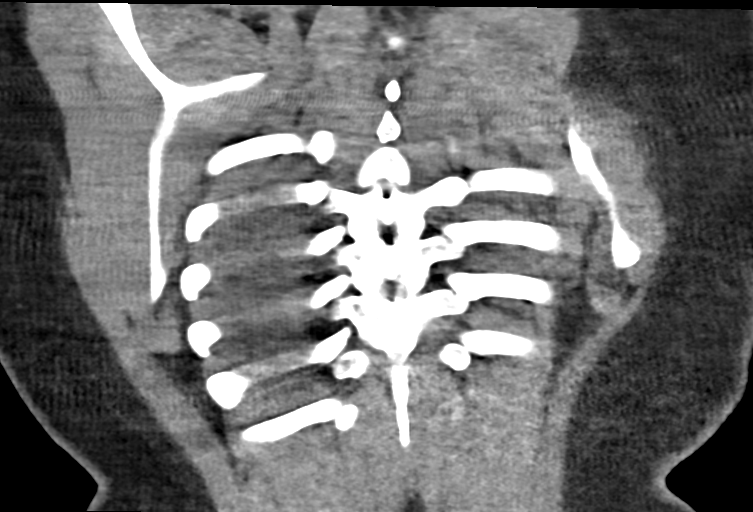

[18 of 46 positions shown; findings below may reference images not displayed]

FINDINGS: Cardiovascular: Satisfactory opacification of the pulmonary arteries
to the segmental level. No evidence of pulmonary embolism. No
pericardial effusion. Nonaneurysmal aorta. No dissection seen.

Mediastinum/Nodes: No enlarged mediastinal, hilar, or axillary lymph
nodes. Thyroid gland, trachea, and esophagus demonstrate no
significant findings.

Lungs/Pleura: Small bilateral pleural effusions. Hazy dependent
atelectasis. No consolidation or pneumothorax

Upper Abdomen: No acute abnormality.

Musculoskeletal: No chest wall abnormality. No acute or significant
osseous findings.

Review of the MIP images confirms the above findings.
IMPRESSION: 1. Negative for acute pulmonary embolus or aortic dissection.
2. Small bilateral pleural effusions.

## 2021-09-06 ENCOUNTER — Encounter: Payer: Self-pay | Admitting: Nurse Practitioner

## 2021-09-06 ENCOUNTER — Other Ambulatory Visit: Payer: Self-pay

## 2021-09-06 ENCOUNTER — Ambulatory Visit: Payer: Medicaid Other | Attending: Nurse Practitioner | Admitting: Nurse Practitioner

## 2021-09-06 VITALS — BP 138/88 | HR 111 | Ht 68.5 in | Wt 239.1 lb

## 2021-09-06 DIAGNOSIS — R030 Elevated blood-pressure reading, without diagnosis of hypertension: Secondary | ICD-10-CM | POA: Diagnosis not present

## 2021-09-06 DIAGNOSIS — R0609 Other forms of dyspnea: Secondary | ICD-10-CM | POA: Diagnosis not present

## 2021-09-06 DIAGNOSIS — K5909 Other constipation: Secondary | ICD-10-CM

## 2021-09-06 DIAGNOSIS — Z23 Encounter for immunization: Secondary | ICD-10-CM | POA: Diagnosis not present

## 2021-09-06 DIAGNOSIS — R600 Localized edema: Secondary | ICD-10-CM | POA: Diagnosis not present

## 2021-09-06 DIAGNOSIS — Z7689 Persons encountering health services in other specified circumstances: Secondary | ICD-10-CM

## 2021-09-06 MED ORDER — SENNOSIDES-DOCUSATE SODIUM 8.6-50 MG PO TABS: 2.0000 | ORAL_TABLET | Freq: Every day | ORAL | 3 refills | Status: AC

## 2021-09-06 NOTE — Progress Notes (Signed)
Assessment & Plan:  Tracy Bond was seen today for establish care.  Diagnoses and all orders for this visit:  Encounter to establish care  Dyspnea on exertion -     ECHOCARDIOGRAM COMPLETE; Future  Elevated blood pressure reading Follow-up in 2 to 3 weeks for repeat blood pressure check Her mother has also been advised to monitor blood pressure at home. Remember to bring in your blood pressure log with you for your follow up appointment.  DASH/Mediterranean Diets are healthier choices for HTN.    Bilateral leg edema -     ECHOCARDIOGRAM COMPLETE; Future Diet is high in fat, low sodium and cholesterol. We discussed dietary adherence in regards to low-sodium low-fat and low-cholesterol as well as exercise modifications including walking an hour a day  Need for influenza vaccination -     Flu Vaccine QUAD 73mo+IM (Fluarix, Fluzone & Alfiuria Quad PF)  Chronic constipation -     senna-docusate (SENOKOT-S) 8.6-50 MG tablet; Take 2 tablets by mouth daily. May increase to 2 tablets in the evening if no improvement in BM after 1 week Increase water intake to a minimum of 80-100 ounces daily  Patient has been counseled on age-appropriate routine health concerns for screening and prevention. These are reviewed and up-to-date. Referrals have been placed accordingly. Immunizations are up-to-date or declined.    Subjective:   Chief Complaint  Patient presents with   Establish Care   HPI Tracy Bond 20 y.o. female presents to office today to establish care.   She has not established care with a primary care provider in a few years.   She is seeing a Recruitment consultant due to history of schizoaffective disorder/psychosis.  States she has been gaining weight with being prescribed Celexa, Abilify and Symmetrel  Edema: Patient complains of edema. The location of the edema is bilateral hands and bilateral lower extremities.  The edema has been moderate.  Onset of  symptoms was several weeks ago, unchanged since that time. The edema is present all day. The swelling has been aggravated by increased salt intake, relieved by nothing, and been associated with shortness of breath and weight gain. Cardiac risk factors include obesity (BMI >= 30 kg/m2) and sedentary lifestyle.  States shortness of breath is present with mild exertion as well as when lying flat.    Review of Systems  Constitutional:  Negative for fever, malaise/fatigue and weight loss.  HENT: Negative.  Negative for nosebleeds.   Eyes: Negative.  Negative for blurred vision, double vision and photophobia.  Respiratory:  Positive for shortness of breath. Negative for cough.   Cardiovascular:  Positive for orthopnea and leg swelling. Negative for chest pain and palpitations.  Gastrointestinal: Negative.  Negative for heartburn, nausea and vomiting.  Musculoskeletal: Negative.  Negative for myalgias.  Neurological: Negative.  Negative for dizziness, focal weakness, seizures and headaches.  Psychiatric/Behavioral:  Negative for suicidal ideas.        See HPI   Past Medical History:  Diagnosis Date   Asthma    Medical history non-contributory    Psychosis (HCC) 03/19/2017    No past surgical history on file.  Family History  Problem Relation Age of Onset   Mental illness Neg Hx     Social History Reviewed with no changes to be made today.   Outpatient Medications Prior to Visit  Medication Sig Dispense Refill   amantadine (SYMMETREL) 100 MG capsule Take 1 capsule (100 mg total) by mouth 2 (two) times daily. 7 capsule 0  ARIPiprazole (ABILIFY) 20 MG tablet Take 1 tablet (20 mg total) by mouth at bedtime. 30 tablet 0   busPIRone (BUSPAR) 10 MG tablet Take 1 tablet (10 mg total) by mouth 3 (three) times daily. 21 tablet 0   citalopram (CELEXA) 20 MG tablet Take 1 tablet (20 mg total) by mouth at bedtime. 7 tablet 0   albuterol (PROVENTIL HFA;VENTOLIN HFA) 108 (90 Base) MCG/ACT inhaler  Inhale 2 puffs into the lungs every 4 (four) hours as needed for wheezing or shortness of breath. (Patient not taking: Reported on 09/06/2021) 1 Inhaler 3   ARIPiprazole (ABILIFY) 20 MG tablet Take 20 mg by mouth daily. (Patient not taking: Reported on 09/06/2021)     cetirizine (ZYRTEC) 10 MG tablet Take 10 mg by mouth daily as needed for allergies. (Patient not taking: Reported on 09/06/2021)     hydrOXYzine (ATARAX/VISTARIL) 25 MG tablet Take 1 tablet (25 mg total) by mouth 3 (three) times daily as needed for anxiety. (Patient not taking: Reported on 09/06/2021) 21 tablet 0   No facility-administered medications prior to visit.    No Known Allergies     Objective:    BP 138/88   Pulse (!) 111   Ht 5' 8.5" (1.74 m)   Wt 239 lb 2 oz (108.5 kg)   LMP 08/16/2021   SpO2 99%   BMI 35.83 kg/m  Wt Readings from Last 3 Encounters:  09/06/21 239 lb 2 oz (108.5 kg)  08/16/20 208 lb (94.3 kg)  08/15/20 151 lb 14.4 oz (68.9 kg)    Physical Exam Vitals and nursing note reviewed.  Constitutional:      Appearance: She is well-developed.  HENT:     Head: Normocephalic and atraumatic.  Cardiovascular:     Rate and Rhythm: Regular rhythm. Tachycardia present.     Heart sounds: Normal heart sounds. No murmur heard.   No friction rub. No gallop.  Pulmonary:     Effort: Pulmonary effort is normal. No tachypnea or respiratory distress.     Breath sounds: Normal breath sounds. No decreased breath sounds, wheezing, rhonchi or rales.  Chest:     Chest wall: No tenderness.  Abdominal:     General: Bowel sounds are normal.     Palpations: Abdomen is soft.  Musculoskeletal:        General: Normal range of motion.     Cervical back: Normal range of motion.  Skin:    General: Skin is warm and dry.  Neurological:     Mental Status: She is alert and oriented to person, place, and time.     Coordination: Coordination normal.  Psychiatric:        Behavior: Behavior normal. Behavior is cooperative.         Thought Content: Thought content normal.        Judgment: Judgment normal.         Patient has been counseled extensively about nutrition and exercise as well as the importance of adherence with medications and regular follow-up. The patient was given clear instructions to go to ER or return to medical center if symptoms don't improve, worsen or new problems develop. The patient verbalized understanding.   Follow-up: Return in about 2 weeks (around 09/20/2021) for 2-3 weeks BP check Nurse visit or Lurena Joiner. See me in Feb for physical.   Gildardo Pounds, FNP-BC [facility] [place], [place]   09/06/2021, 1:22 PM

## 2021-09-06 NOTE — Progress Notes (Signed)
Hand swollen

## 2021-09-06 NOTE — Progress Notes (Signed)
Scheduled

## 2021-10-16 ENCOUNTER — Ambulatory Visit (HOSPITAL_COMMUNITY): Admission: RE | Admit: 2021-10-16 | Payer: Medicaid Other | Source: Ambulatory Visit

## 2021-10-24 ENCOUNTER — Ambulatory Visit (HOSPITAL_COMMUNITY)
Admission: RE | Admit: 2021-10-24 | Discharge: 2021-10-24 | Disposition: A | Discharge status: Home / Self Care | Payer: Medicaid Other | Source: Ambulatory Visit | Attending: Nurse Practitioner | Admitting: Nurse Practitioner

## 2021-10-24 ENCOUNTER — Other Ambulatory Visit: Payer: Self-pay

## 2021-10-24 DIAGNOSIS — R0609 Other forms of dyspnea: Secondary | ICD-10-CM | POA: Diagnosis not present

## 2021-10-24 DIAGNOSIS — R600 Localized edema: Secondary | ICD-10-CM

## 2021-10-24 DIAGNOSIS — R000 Tachycardia, unspecified: Secondary | ICD-10-CM | POA: Insufficient documentation

## 2021-10-24 DIAGNOSIS — R0600 Dyspnea, unspecified: Secondary | ICD-10-CM | POA: Diagnosis present

## 2021-10-24 LAB — ECHOCARDIOGRAM COMPLETE
Area-P 1/2: 5.09 cm2
S' Lateral: 2.2 cm

## 2021-11-06 ENCOUNTER — Telehealth: Payer: Self-pay

## 2021-11-06 NOTE — Telephone Encounter (Signed)
Called patient reviewed all information and repeated back to me. Will call if any questions.  ? ?

## 2022-01-06 ENCOUNTER — Ambulatory Visit: Payer: Medicaid Other | Attending: Nurse Practitioner | Admitting: Nurse Practitioner

## 2022-01-06 ENCOUNTER — Encounter: Payer: Self-pay | Admitting: Nurse Practitioner

## 2022-01-06 VITALS — BP 125/83 | HR 106 | Wt 245.2 lb

## 2022-01-06 DIAGNOSIS — D72829 Elevated white blood cell count, unspecified: Secondary | ICD-10-CM | POA: Insufficient documentation

## 2022-01-06 DIAGNOSIS — Z8659 Personal history of other mental and behavioral disorders: Secondary | ICD-10-CM | POA: Diagnosis not present

## 2022-01-06 DIAGNOSIS — Z760 Encounter for issue of repeat prescription: Secondary | ICD-10-CM | POA: Diagnosis not present

## 2022-01-06 DIAGNOSIS — R600 Localized edema: Secondary | ICD-10-CM | POA: Diagnosis not present

## 2022-01-06 DIAGNOSIS — J45909 Unspecified asthma, uncomplicated: Secondary | ICD-10-CM | POA: Diagnosis not present

## 2022-01-06 DIAGNOSIS — M7989 Other specified soft tissue disorders: Secondary | ICD-10-CM | POA: Insufficient documentation

## 2022-01-06 NOTE — Progress Notes (Signed)
? ?Assessment & Plan:  ?[name] was seen today for foot swelling, hands swelling and medication refill. ? ?Diagnoses and all orders for this visit: ? ?Bilateral hand swelling ?-     CMP14+EGFR ?-     Rheumatoid factor ? ?Bilateral lower extremity edema ?-     CMP14+EGFR ?-     Rheumatoid factor ? ?Leukocytosis, unspecified type ?-     CBC with Differential ? ? ? ?Patient has been counseled on age-appropriate routine health concerns for screening and prevention. These are reviewed and up-to-date. Referrals have been placed accordingly. Immunizations are up-to-date or declined.    ?Subjective:  ? ?Chief Complaint  ?Patient presents with  ? Foot Swelling  ? hands swelling  ? Medication Refill  ? ?HPI ?Tracy Bond 21 y.o. female presents to office today accompanied by her mother. She has complaints of bilateral hand and foot edema.  ?She has a past medical history of Asthma, schizoaffective disorder/bipolar type and Psychosis (03/19/2017).  ? ?Marianita and her mom are concerned about swelling she has in her hands and feet. After lengthy discussion Nasya needs to make a few more dietary modifications in regard to sodium intake. Also I believe a few of her psychotropic medications may be contributing to the swelling she is experiencing. She will need to discuss this with her behavioral health specialist. She does endorse shortness of breath with exertion however she does not exercise regularly and BMI is 37. Echocardiogram in January was normal.  ? ?Review of Systems  ?Constitutional:  Negative for fever, malaise/fatigue and weight loss.  ?HENT: Negative.  Negative for nosebleeds.   ?Eyes: Negative.  Negative for blurred vision, double vision and photophobia.  ?Respiratory:  Positive for shortness of breath. Negative for cough, hemoptysis, sputum production and wheezing.   ?Cardiovascular:  Positive for leg swelling. Negative for chest pain, palpitations and PND.  ?Gastrointestinal: Negative.  Negative for heartburn,  nausea and vomiting.  ?Musculoskeletal: Negative.  Negative for myalgias.  ?Neurological: Negative.  Negative for dizziness, focal weakness, seizures and headaches.  ?Psychiatric/Behavioral: Negative.  Negative for suicidal ideas.   ? ?Past Medical History:  ?Diagnosis Date  ? Asthma   ? Medical history non-contributory   ? Psychosis ([place]) 03/19/2017  ? ? ?No past surgical history on file. ? ?Family History  ?Problem Relation Age of Onset  ? Mental illness Neg Hx   ? ? ?Social History Reviewed with no changes to be made today.  ? ?Outpatient Medications Prior to Visit  ?Medication Sig Dispense Refill  ? amantadine (SYMMETREL) 100 MG capsule Take 1 capsule (100 mg total) by mouth 2 (two) times daily. 7 capsule 0  ? ARIPiprazole (ABILIFY) 20 MG tablet Take 1 tablet (20 mg total) by mouth at bedtime. 30 tablet 0  ? busPIRone (BUSPAR) 10 MG tablet Take 1 tablet (10 mg total) by mouth 3 (three) times daily. 21 tablet 0  ? citalopram (CELEXA) 20 MG tablet Take 1 tablet (20 mg total) by mouth at bedtime. 7 tablet 0  ? albuterol (PROVENTIL HFA;VENTOLIN HFA) 108 (90 Base) MCG/ACT inhaler Inhale 2 puffs into the lungs every 4 (four) hours as needed for wheezing or shortness of breath. (Patient not taking: Reported on 09/06/2021) 1 Inhaler 3  ? ?No facility-administered medications prior to visit.  ? ? ?No Known Allergies ? ?   ?Objective:  ?  ?BP 125/83   Pulse (!) 106   Wt 245 lb 3.2 oz (111.2 kg)   SpO2 100%   BMI 36.74  kg/m?  ?Wt Readings from Last 3 Encounters:  ?01/06/22 245 lb 3.2 oz (111.2 kg)  ?09/06/21 239 lb 2 oz (108.5 kg)  ?08/16/20 208 lb (94.3 kg)  ? ? ?Physical Exam ?Vitals and nursing note reviewed.  ?Constitutional:   ?   Appearance: She is well-developed. She is obese.  ?HENT:  ?   Head: Normocephalic and atraumatic.  ?Cardiovascular:  ?   Rate and Rhythm: Regular rhythm. Tachycardia present.  ?   Heart sounds: Normal heart sounds. No murmur heard. ?  No friction rub. No gallop.  ?Pulmonary:  ?   Effort:  Pulmonary effort is normal. No tachypnea or respiratory distress.  ?   Breath sounds: Normal breath sounds. No decreased breath sounds, wheezing, rhonchi or rales.  ?Chest:  ?   Chest wall: No tenderness.  ?Abdominal:  ?   General: Bowel sounds are normal.  ?   Palpations: Abdomen is soft.  ?Musculoskeletal:     ?   General: Normal range of motion.  ?   Cervical back: Normal range of motion.  ?Skin: ?   General: Skin is warm and dry.  ?Neurological:  ?   Mental Status: She is alert and oriented to person, place, and time.  ?   Coordination: Coordination normal.  ?Psychiatric:     ?   Behavior: Behavior normal. Behavior is cooperative.     ?   Thought Content: Thought content normal.     ?   Judgment: Judgment normal.  ? ? ? ? ?   ?Patient has been counseled extensively about nutrition and exercise as well as the importance of adherence with medications and regular follow-up. The patient was given clear instructions to go to ER or return to medical center if symptoms don't improve, worsen or new problems develop. The patient verbalized understanding.  ? ?Follow-up: Return for [place].  ? ?Gildardo Pounds, FNP-BC ?[place] ?[place], [place] ?[phone]   ?01/06/2022, 7:48 PM ?

## 2022-01-07 LAB — CMP14+EGFR
ALT: 11 IU/L (ref 0–32)
AST: 13 IU/L (ref 0–40)
Albumin/Globulin Ratio: 1.5 (ref 1.2–2.2)
Albumin: 4.6 g/dL (ref 3.9–5.0)
Alkaline Phosphatase: 84 IU/L (ref 44–121)
BUN/Creatinine Ratio: 10 (ref 9–23)
BUN: 10 mg/dL (ref 6–20)
Bilirubin Total: <0.2 mg/dL (ref 0.0–1.2)
CO2: 22 mmol/L (ref 20–29)
Calcium: 10 mg/dL (ref 8.7–10.2)
Chloride: 101 mmol/L (ref 96–106)
Creatinine, Ser: 1.03 mg/dL — ABNORMAL HIGH (ref 0.57–1.00)
Globulin, Total: 3 g/dL (ref 1.5–4.5)
Glucose: 79 mg/dL (ref 70–99)
Potassium: 4.7 mmol/L (ref 3.5–5.2)
Sodium: 139 mmol/L (ref 134–144)
Total Protein: 7.6 g/dL (ref 6.0–8.5)
eGFR: 79 mL/min/{1.73_m2} (ref >59)

## 2022-01-07 LAB — CBC WITH DIFFERENTIAL/PLATELET
Basophils Absolute: 0.1 10*3/uL (ref 0.0–0.2)
Basos: 1 %
EOS (ABSOLUTE): 0.2 10*3/uL (ref 0.0–0.4)
Eos: 2 %
Hematocrit: 42.3 % (ref 34.0–46.6)
Hemoglobin: 13.9 g/dL (ref 11.1–15.9)
Immature Grans (Abs): 0 10*3/uL (ref 0.0–0.1)
Immature Granulocytes: 0 %
Lymphocytes Absolute: 2.6 10*3/uL (ref 0.7–3.1)
Lymphs: 33 %
MCH: 28.4 pg (ref 26.6–33.0)
MCHC: 32.9 g/dL (ref 31.5–35.7)
MCV: 86 fL (ref 79–97)
Monocytes Absolute: 0.6 10*3/uL (ref 0.1–0.9)
Monocytes: 7 %
Neutrophils Absolute: 4.5 10*3/uL (ref 1.4–7.0)
Neutrophils: 57 %
Platelets: 268 10*3/uL (ref 150–450)
RBC: 4.9 x10E6/uL (ref 3.77–5.28)
RDW: 13 % (ref 11.7–15.4)
WBC: 7.9 10*3/uL (ref 3.4–10.8)

## 2022-01-07 LAB — RHEUMATOID FACTOR: Rheumatoid fact SerPl-aCnc: <10 IU/mL (ref <14.0)

## 2022-05-09 ENCOUNTER — Ambulatory Visit: Payer: Medicaid Other | Attending: Nurse Practitioner | Admitting: Nurse Practitioner

## 2022-05-09 ENCOUNTER — Encounter: Payer: Self-pay | Admitting: Nurse Practitioner

## 2022-05-09 VITALS — BP 122/81 | HR 112 | Temp 97.9°F | Resp 17 | Ht 69.0 in | Wt 242.0 lb

## 2022-05-09 DIAGNOSIS — F250 Schizoaffective disorder, bipolar type: Secondary | ICD-10-CM

## 2022-05-09 DIAGNOSIS — L2084 Intrinsic (allergic) eczema: Secondary | ICD-10-CM

## 2022-05-09 DIAGNOSIS — Z0000 Encounter for general adult medical examination without abnormal findings: Secondary | ICD-10-CM

## 2022-05-09 MED ORDER — TRIAMCINOLONE ACETONIDE 0.025 % EX LOTN: TOPICAL_LOTION | CUTANEOUS | 6 refills | Status: AC

## 2022-05-09 NOTE — Progress Notes (Signed)
Assessment & Plan:  Tracy Bond was seen today for annual exam.  Diagnoses and all orders for this visit:  Encounter for annual physical exam  Schizoaffective disorder, bipolar type (HCC) Continue all medications and follow up with [facility] as instructed  Intrinsic atopic dermatitis -     Triamcinolone Acetonide 0.025 % LOTN; Apply twice a day as needed    Patient has been counseled on age-appropriate routine health concerns for screening and prevention. These are reviewed and up-to-date. Referrals have been placed accordingly. Immunizations are up-to-date or declined.    Subjective:   Chief Complaint  Patient presents with   Annual Exam    Refill of triamcinolone acetonide lotion from "Daphne" (former physician in [place])   HPI Tracy Bond 21 y.o. female presents to office today for annual physical exam.  She is accompanied by her mother today.   Review of Systems  Constitutional:  Negative for fever, malaise/fatigue and weight loss.  HENT: Negative.  Negative for nosebleeds.   Eyes: Negative.  Negative for blurred vision, double vision and photophobia.  Respiratory: Negative.  Negative for cough and shortness of breath.   Cardiovascular: Negative.  Negative for chest pain, palpitations and leg swelling.  Gastrointestinal: Negative.  Negative for heartburn, nausea and vomiting.  Genitourinary: Negative.   Musculoskeletal: Negative.  Negative for myalgias.  Skin: Negative.        Atopic dermatitis-under bilateral breasts  Neurological: Negative.  Negative for dizziness, focal weakness, seizures and headaches.  Endo/Heme/Allergies: Negative.   Psychiatric/Behavioral: Negative.  Negative for suicidal ideas.     Past Medical History:  Diagnosis Date   Asthma    Medical history non-contributory    Psychosis (HCC) 03/19/2017    History reviewed. No pertinent surgical history.  Family History  Problem Relation Age of Onset   Mental illness Neg Hx     Social History  Reviewed with no changes to be made today.   Outpatient Medications Prior to Visit  Medication Sig Dispense Refill   amantadine (SYMMETREL) 100 MG capsule Take 1 capsule (100 mg total) by mouth 2 (two) times daily. 7 capsule 0   ARIPiprazole (ABILIFY) 20 MG tablet Take 1 tablet (20 mg total) by mouth at bedtime. 30 tablet 0   busPIRone (BUSPAR) 10 MG tablet Take 1 tablet (10 mg total) by mouth 3 (three) times daily. 21 tablet 0   citalopram (CELEXA) 20 MG tablet Take 1 tablet (20 mg total) by mouth at bedtime. 7 tablet 0   albuterol (PROVENTIL HFA;VENTOLIN HFA) 108 (90 Base) MCG/ACT inhaler Inhale 2 puffs into the lungs every 4 (four) hours as needed for wheezing or shortness of breath. (Patient not taking: Reported on 09/06/2021) 1 Inhaler 3   No facility-administered medications prior to visit.    No Known Allergies     Objective:    BP 122/81 (BP Location: Right Arm, Patient Position: Sitting, Cuff Size: Large)   Pulse (!) 112   Temp 97.9 F (36.6 C)   Resp 17   Ht 5\' 9"  (1.753 m)   Wt 242 lb (109.8 kg)   SpO2 96%   BMI 35.74 kg/m  Wt Readings from Last 3 Encounters:  05/09/22 242 lb (109.8 kg)  01/06/22 245 lb 3.2 oz (111.2 kg)  09/06/21 239 lb 2 oz (108.5 kg)    Physical Exam Constitutional:      Appearance: She is well-developed.  HENT:     Head: Normocephalic and atraumatic.     Right Ear: Hearing, tympanic membrane,  ear canal and external ear normal.     Left Ear: Hearing, tympanic membrane, ear canal and external ear normal.     Nose: Nose normal.     Right Turbinates: Not enlarged.     Left Turbinates: Not enlarged.     Mouth/Throat:     Lips: Pink.     Mouth: Mucous membranes are moist.     Dentition: No dental tenderness, gingival swelling, dental abscesses or gum lesions.     Pharynx: No oropharyngeal exudate.  Eyes:     General: No scleral icterus.       Right eye: No discharge.     Extraocular Movements: Extraocular movements intact.      Conjunctiva/sclera: Conjunctivae normal.     Pupils: Pupils are equal, round, and reactive to light.  Neck:     Thyroid: No thyromegaly.     Trachea: No tracheal deviation.  Cardiovascular:     Rate and Rhythm: Normal rate and regular rhythm.     Heart sounds: Normal heart sounds. No murmur heard.    No friction rub.  Pulmonary:     Effort: Pulmonary effort is normal. No accessory muscle usage or respiratory distress.     Breath sounds: Normal breath sounds. No decreased breath sounds, wheezing, rhonchi or rales.  Abdominal:     General: Bowel sounds are normal. There is no distension.     Palpations: Abdomen is soft. There is no mass.     Tenderness: There is no abdominal tenderness. There is no right CVA tenderness, left CVA tenderness, guarding or rebound.     Hernia: No hernia is present.  Musculoskeletal:        General: No tenderness or deformity. Normal range of motion.     Cervical back: Normal range of motion and neck supple.  Lymphadenopathy:     Cervical: No cervical adenopathy.  Skin:    General: Skin is warm and dry.     Findings: No erythema.  Neurological:     Mental Status: She is alert and oriented to person, place, and time.     Cranial Nerves: No cranial nerve deficit.     Motor: Motor function is intact.     Coordination: Coordination is intact. Coordination normal.     Gait: Gait is intact.     Deep Tendon Reflexes:     Reflex Scores:      Patellar reflexes are 1+ on the right side and 1+ on the left side. Psychiatric:        Attention and Perception: Attention normal.        Mood and Affect: Affect is blunt.        Speech: Speech normal.        Behavior: Behavior normal.          Patient has been counseled extensively about nutrition and exercise as well as the importance of adherence with medications and regular follow-up. The patient was given clear instructions to go to ER or return to medical center if symptoms don't improve, worsen or new  problems develop. The patient verbalized understanding.   Follow-up: Return if symptoms worsen or fail to improve.   Claiborne Rigg, FNP-BC [facility] [place], [place] [phone]   05/09/2022, 3:08 PM

## 2023-05-19 ENCOUNTER — Other Ambulatory Visit: Payer: Self-pay | Admitting: Nurse Practitioner

## 2023-05-19 DIAGNOSIS — L2084 Intrinsic (allergic) eczema: Secondary | ICD-10-CM

## 2023-05-21 ENCOUNTER — Other Ambulatory Visit: Payer: Self-pay | Admitting: Nurse Practitioner

## 2023-05-21 DIAGNOSIS — L2084 Intrinsic (allergic) eczema: Secondary | ICD-10-CM

## 2024-08-16 ENCOUNTER — Encounter: Payer: MEDICAID | Admitting: Nurse Practitioner

## 2024-09-01 ENCOUNTER — Other Ambulatory Visit: Payer: Self-pay

## 2024-09-01 ENCOUNTER — Ambulatory Visit (HOSPITAL_COMMUNITY)
Admission: EM | Admit: 2024-09-01 | Discharge: 2024-09-02 | Disposition: A | Payer: MEDICAID | Attending: Psychiatry | Admitting: Psychiatry

## 2024-09-01 DIAGNOSIS — Z818 Family history of other mental and behavioral disorders: Secondary | ICD-10-CM | POA: Diagnosis not present

## 2024-09-01 DIAGNOSIS — Z560 Unemployment, unspecified: Secondary | ICD-10-CM | POA: Diagnosis not present

## 2024-09-01 DIAGNOSIS — F250 Schizoaffective disorder, bipolar type: Secondary | ICD-10-CM

## 2024-09-01 DIAGNOSIS — F79 Unspecified intellectual disabilities: Secondary | ICD-10-CM | POA: Diagnosis not present

## 2024-09-01 DIAGNOSIS — Z79899 Other long term (current) drug therapy: Secondary | ICD-10-CM | POA: Diagnosis not present

## 2024-09-01 DIAGNOSIS — F419 Anxiety disorder, unspecified: Secondary | ICD-10-CM | POA: Diagnosis not present

## 2024-09-01 LAB — POCT URINE DRUG SCREEN - MANUAL ENTRY (I-SCREEN)
POC Amphetamine UR: NOT DETECTED
POC Buprenorphine (BUP): NOT DETECTED
POC Cocaine UR: NOT DETECTED
POC Marijuana UR: NOT DETECTED
POC Methadone UR: NOT DETECTED
POC Methamphetamine UR: NOT DETECTED
POC Morphine: NOT DETECTED
POC Oxazepam (BZO): NOT DETECTED
POC Oxycodone UR: NOT DETECTED
POC Secobarbital (BAR): NOT DETECTED

## 2024-09-01 LAB — URINALYSIS, ROUTINE W REFLEX MICROSCOPIC
Bilirubin Urine: NEGATIVE
Glucose, UA: NEGATIVE mg/dL
Hgb urine dipstick: NEGATIVE
Ketones, ur: NEGATIVE mg/dL
Nitrite: NEGATIVE
Protein, ur: NEGATIVE mg/dL
Specific Gravity, Urine: 1.024 (ref 1.005–1.030)
pH: 5 (ref 5.0–8.0)

## 2024-09-01 LAB — COMPREHENSIVE METABOLIC PANEL WITH GFR
ALT: 18 U/L (ref 0–44)
AST: 19 U/L (ref 15–41)
Albumin: 3.7 g/dL (ref 3.5–5.0)
Alkaline Phosphatase: 70 U/L (ref 38–126)
Anion gap: 13 (ref 5–15)
BUN: 10 mg/dL (ref 6–20)
CO2: 23 mmol/L (ref 22–32)
Calcium: 9.2 mg/dL (ref 8.9–10.3)
Chloride: 102 mmol/L (ref 98–111)
Creatinine, Ser: 0.9 mg/dL (ref 0.44–1.00)
GFR, Estimated: >60 mL/min (ref >60)
Glucose, Bld: 80 mg/dL (ref 70–99)
Potassium: 4.4 mmol/L (ref 3.5–5.1)
Sodium: 138 mmol/L (ref 135–145)
Total Bilirubin: 0.6 mg/dL (ref 0.0–1.2)
Total Protein: 7.9 g/dL (ref 6.5–8.1)

## 2024-09-01 LAB — MAGNESIUM: Magnesium: 2.1 mg/dL (ref 1.7–2.4)

## 2024-09-01 LAB — CBC WITH DIFFERENTIAL/PLATELET
Abs Immature Granulocytes: 0.02 K/uL (ref 0.00–0.07)
Basophils Absolute: 0.1 K/uL (ref 0.0–0.1)
Basophils Relative: 1 %
Eosinophils Absolute: 0.1 K/uL (ref 0.0–0.5)
Eosinophils Relative: 1 %
HCT: 43 % (ref 36.0–46.0)
Hemoglobin: 13.8 g/dL (ref 12.0–15.0)
Immature Granulocytes: 0 %
Lymphocytes Relative: 34 %
Lymphs Abs: 3.5 K/uL (ref 0.7–4.0)
MCH: 28.1 pg (ref 26.0–34.0)
MCHC: 32.1 g/dL (ref 30.0–36.0)
MCV: 87.6 fL (ref 80.0–100.0)
Monocytes Absolute: 0.5 K/uL (ref 0.1–1.0)
Monocytes Relative: 5 %
Neutro Abs: 6 K/uL (ref 1.7–7.7)
Neutrophils Relative %: 59 %
Platelets: 260 K/uL (ref 150–400)
RBC: 4.91 MIL/uL (ref 3.87–5.11)
RDW: 13.7 % (ref 11.5–15.5)
WBC: 10.2 K/uL (ref 4.0–10.5)
nRBC: 0 % (ref 0.0–0.2)

## 2024-09-01 LAB — ETHANOL: Alcohol, Ethyl (B): <15 mg/dL (ref <15)

## 2024-09-01 LAB — HEMOGLOBIN A1C
Hgb A1c MFr Bld: 5.4 % (ref 4.8–5.6)
Mean Plasma Glucose: 108.28 mg/dL

## 2024-09-01 LAB — LIPID PANEL
Cholesterol: 218 mg/dL — ABNORMAL HIGH (ref 0–200)
HDL: 64 mg/dL (ref >40)
LDL Cholesterol: 130 mg/dL — ABNORMAL HIGH (ref 0–99)
Total CHOL/HDL Ratio: 3.4 ratio
Triglycerides: 120 mg/dL (ref <150)
VLDL: 24 mg/dL (ref 0–40)

## 2024-09-01 LAB — TSH: TSH: 3.431 u[IU]/mL (ref 0.350–4.500)

## 2024-09-01 LAB — POC URINE PREG, ED: Preg Test, Ur: NEGATIVE

## 2024-09-01 MED ORDER — DIPHENHYDRAMINE HCL 50 MG PO CAPS: 50.0000 mg | ORAL_CAPSULE | Freq: Three times a day (TID) | ORAL | Status: DC | PRN

## 2024-09-01 MED ORDER — ALUM & MAG HYDROXIDE-SIMETH 200-200-20 MG/5ML PO SUSP: 30.0000 mL | ORAL | Status: DC | PRN

## 2024-09-01 MED ORDER — HALOPERIDOL LACTATE 5 MG/ML IJ SOLN: 10.0000 mg | Freq: Three times a day (TID) | INTRAMUSCULAR | Status: DC | PRN

## 2024-09-01 MED ORDER — HALOPERIDOL LACTATE 5 MG/ML IJ SOLN: 5.0000 mg | Freq: Three times a day (TID) | INTRAMUSCULAR | Status: DC | PRN

## 2024-09-01 MED ORDER — CITALOPRAM HYDROBROMIDE 20 MG PO TABS
20.0000 mg | ORAL_TABLET | Freq: Every day | ORAL | Status: DC
  Administered 2024-09-01: 20 mg via ORAL
  Filled 2024-09-01: qty 1

## 2024-09-01 MED ORDER — LORAZEPAM 2 MG/ML IJ SOLN: 2.0000 mg | Freq: Three times a day (TID) | INTRAMUSCULAR | Status: DC | PRN

## 2024-09-01 MED ORDER — BUSPIRONE HCL 10 MG PO TABS
10.0000 mg | ORAL_TABLET | Freq: Three times a day (TID) | ORAL | Status: DC
  Administered 2024-09-01 – 2024-09-02 (×2): 10 mg via ORAL
  Filled 2024-09-01 (×2): qty 1

## 2024-09-01 MED ORDER — MAGNESIUM HYDROXIDE 400 MG/5ML PO SUSP: 30.0000 mL | Freq: Every day | ORAL | Status: DC | PRN

## 2024-09-01 MED ORDER — DIPHENHYDRAMINE HCL 50 MG/ML IJ SOLN: 50.0000 mg | Freq: Three times a day (TID) | INTRAMUSCULAR | Status: DC | PRN

## 2024-09-01 MED ORDER — ARIPIPRAZOLE 10 MG PO TABS
20.0000 mg | ORAL_TABLET | Freq: Every day | ORAL | Status: DC
  Administered 2024-09-01: 20 mg via ORAL
  Filled 2024-09-01: qty 2

## 2024-09-01 MED ORDER — BUSPIRONE HCL 10 MG PO TABS: 20.0000 mg | ORAL_TABLET | Freq: Three times a day (TID) | ORAL | Status: DC

## 2024-09-01 MED ORDER — HYDROXYZINE HCL 25 MG PO TABS
25.0000 mg | ORAL_TABLET | Freq: Three times a day (TID) | ORAL | Status: DC | PRN
  Administered 2024-09-01: 25 mg via ORAL
  Filled 2024-09-01: qty 1

## 2024-09-01 MED ORDER — ACETAMINOPHEN 325 MG PO TABS: 650.0000 mg | ORAL_TABLET | Freq: Four times a day (QID) | ORAL | Status: DC | PRN

## 2024-09-01 MED ORDER — HALOPERIDOL 5 MG PO TABS: 5.0000 mg | ORAL_TABLET | Freq: Three times a day (TID) | ORAL | Status: DC | PRN

## 2024-09-01 NOTE — Progress Notes (Signed)
   09/01/24 1727  [facility] Triage Screening (Walk-ins at [facility] only)  What Is the Reason for Your Visit/Call Today? Sophia Sperry 23y female presents to [facility] unaccompanied. PT states she is diagnosed w/ schizophrenia and anxiety; takes medications everyday, per PT. PT shares that she got into an argument w/ her mother earlier; the mother dropped the pt off telling her that she needs to get evaluated. PT claims I don't think I need help. PT shares that her life is going well, she is taking her medications as she should, attends church, doesn't drink or do drugs. PT states I may have gotten disrespectful w/ her but I'm fine. PT denies SI, HI, AVH and alcohol and substance use.  How Long Has This Been Causing You Problems? <Week  Have You Recently Had Any Thoughts About Hurting Yourself? No  Are You Planning to Commit Suicide/Harm Yourself At This time? No  Have you Recently Had Thoughts About Hurting Someone [name]? No  Are You Planning To Harm Someone At This Time? No  Physical Abuse Denies  Verbal Abuse Denies  Sexual Abuse Denies  Exploitation of patient/patient's resources Denies  Self-Neglect Denies  Are you currently experiencing any auditory, visual or other hallucinations? No  Have You Used Any Alcohol or Drugs in the Past 24 Hours? No  Do you have any current medical co-morbidities that require immediate attention?  (asthma)  Clinician description of patient physical appearance/behavior: cooperative, upset and confused to why her mother dropped her off, dressed casually w/ scarf  What Do You Feel Would Help You the Most Today? Social Support (Mental Health Evaluation)  Determination of Need Routine (7 days)  Options For Referral Other: Comment (PT states No, I'm fine when presented w/ options for referral)

## 2024-09-01 NOTE — ED Provider Notes (Signed)
 [facility] Continuous Assessment Admission H&P  Date: 09/01/24 Patient Name: Tracy Bond MRN: [id number] Chief Complaint: I was talking aggressively to my mom  Diagnoses:  Final diagnoses:  Schizoaffective disorder, bipolar type [facility])    HPI: Per triage: Tracy Bond 23y female presents to [facility] unaccompanied. PT states she is diagnosed w/ schizophrenia and anxiety; takes medications everyday, per PT. PT shares that she got into an argument w/ her mother earlier; the mother dropped the pt off telling her that she needs to get evaluated. PT claims I don't think I need help. PT shares that her life is going well, she is taking her medications as she should, attends church, doesn't drink or do drugs. PT states I may have gotten disrespectful w/ her but I'm fine. PT denies SI, HI, AVH and alcohol and substance use.   Per chart review, patient has had prior evaluations and treatment here. Last service 06/2021. She has had multiple admissions to [facility]. Current medications: Abilify  20 mg PO HS, Buspar  20 mg PO TID, Celexa  20 mg PO HS. Patient reports no concerns about these medications.   Patient is evaluated face-to-face by this provider. Chart is reviewed on 09/01/2024.  She is sitting in the assessment room alone. She is receptive and pleasant upon approach. She is appropriately dressed and groomed. Alert and oriented x 4. She appears healthy and well nourished. Does not appear to be preoccupied, or responding to internal stimuli. Patient maintains eye contact throughout this evaluation. Speech is clear, organized and well articulated. She denies hallucinations. Denies suicidal/homicidal ideations, past or present. She does admit to hx of auditory and visual hallucinations, that's why I was diagnosed with Schizophrenia back in 2018.  Tracy Bond reports that she was sent here by her mother due to her inappropriateness at home. Admits to talking aggressively to her mother. Reports this was  triggered by a conversation they had to discuss about housing plan. She does express remorse and states her mother may not allow her to go back home. She does report feeling anxious and somehow overwhelmed. Patient reports taking her medications as prescribed and she is able to describe her medication regimen. She does admit to experiencing anxiety and talking aggressively when I get upset. She expresses concerns about her potential placement at a group home, reporting that mom is trying to send her far away and I don't want to stay far from my family.   Patient reports that her father is is supportive and she often talks to him.  She reports that she sleeps well and appetite is good. Patient denies substance use, past or present.   Collateral information from patient's mother [name] [phone]: Kerra has been talking nasty, having attitude and I wanted her to get evaluated and maybe stay overnight.. We are in the process of getting her into a group home, I know it takes long, she will come and stay with me until we find a group home.   Patient is admitted to Observation unit for overnight monitoring. She will be reevaluated in AM with possible discharge in the afternoon (mom works until 4 pm).    Total Time spent with patient: 45 minutes  Musculoskeletal  Strength & Muscle Tone: within normal limits Gait & Station: normal Patient leans: N/A  Psychiatric Specialty Exam  Presentation General Appearance:  Appropriate for Environment  Eye Contact: Good  Speech: Clear and Coherent  Speech Volume: Normal  Handedness: Right   Mood and Affect  Mood: Anxious  Affect:  Congruent   Thought Process  Thought Processes: Coherent  Descriptions of Associations:Intact  Orientation:Full (Time, Place and Person)  Thought Content:WDL  Diagnosis of Schizophrenia or Schizoaffective disorder in past: No data recorded Duration of Psychotic Symptoms: No data  recorded Hallucinations:Hallucinations: None  Ideas of Reference:None  Suicidal Thoughts:Suicidal Thoughts: No  Homicidal Thoughts:Homicidal Thoughts: No   Sensorium  Memory: Immediate Fair; Recent Fair; Remote Fair  Judgment: Fair  Insight: Fair   Art Therapist  Concentration: Fair  Attention Span: Fair  Recall: [employer] of Knowledge: Fair  Language: Fair   Psychomotor Activity  Psychomotor Activity: Psychomotor Activity: Normal   Assets  Assets: Communication Skills; Desire for Improvement; Physical Health; Social Support   Sleep  Sleep: Sleep: Good   Nutritional Assessment (For OBS and [facility] admissions only) Has the patient had a weight loss or gain of 10 pounds or more in the last 3 months?: No Has the patient had a decrease in food intake/or appetite?: No Does the patient have dental problems?: No Does the patient have eating habits or behaviors that may be indicators of an eating disorder including binging or inducing vomiting?: No Has the patient recently lost weight without trying?: 0 Has the patient been eating poorly because of a decreased appetite?: 0 Malnutrition Screening Tool Score: 0    Physical Exam Vitals and nursing note reviewed.  Constitutional:      Appearance: Normal appearance.  HENT:     Head: Normocephalic and atraumatic.     Right Ear: Tympanic membrane normal.     Left Ear: Tympanic membrane normal.     Nose: Nose normal.     Mouth/Throat:     Mouth: Mucous membranes are moist.  Eyes:     Extraocular Movements: Extraocular movements intact.     Pupils: Pupils are equal, round, and reactive to light.  Cardiovascular:     Rate and Rhythm: Normal rate.     Pulses: Normal pulses.  Pulmonary:     Effort: Pulmonary effort is normal.  Musculoskeletal:        General: Normal range of motion.     Cervical back: Normal range of motion and neck supple.  Neurological:     General: No focal deficit present.      Mental Status: She is alert and oriented to person, place, and time.  Psychiatric:        Thought Content: Thought content normal.    Review of Systems  Constitutional: Negative.   HENT: Negative.    Eyes: Negative.   Respiratory: Negative.    Cardiovascular: Negative.   Gastrointestinal: Negative.   Genitourinary: Negative.   Musculoskeletal: Negative.   Skin: Negative.   Neurological: Negative.   Endo/Heme/Allergies: Negative.   Psychiatric/Behavioral:  The patient is nervous/anxious.     Blood pressure 127/80, pulse 82, temperature 98.6 F (37 C), temperature source Oral, resp. rate 16, SpO2 100%. There is no height or weight on file to calculate BMI.  Past Psychiatric History: Schizoaffective disorder, Bipolar type    Is the patient at risk to self? No  Has the patient been a risk to self in the past 6 months? No .    Has the patient been a risk to self within the distant past? No   Is the patient a risk to others? No   Has the patient been a risk to others in the past 6 months? No   Has the patient been a risk to others within the distant past? No  Past Medical History: NA  Family History: Paternal aunt has MDD and GAD  Social History: Lives with mom. Sees and talks to dad  as needed. Currently unemployed  Last Labs:  No visits with results within 6 Month(s) from this visit.  Latest known visit with results is:  Office Visit on 01/06/2022  Component Date Value Ref Range Status   Glucose 01/06/2022 79  70 - 99 mg/dL Final   BUN 95/89/7976 10  6 - 20 mg/dL Final   Creatinine, Ser 01/06/2022 1.03 (H)  0.57 - 1.00 mg/dL Final   eGFR 95/89/7976 79  >59 mL/min/1.73 Final   BUN/Creatinine Ratio 01/06/2022 10  9 - 23 Final   Sodium 01/06/2022 139  134 - 144 mmol/L Final   Potassium 01/06/2022 4.7  3.5 - 5.2 mmol/L Final   Chloride 01/06/2022 101  96 - 106 mmol/L Final   CO2 01/06/2022 22  20 - 29 mmol/L Final   Calcium 01/06/2022 10.0  8.7 - 10.2 mg/dL Final    Total Protein 01/06/2022 7.6  6.0 - 8.5 g/dL Final   Albumin 95/89/7976 4.6  3.9 - 5.0 g/dL Final   Globulin, Total 01/06/2022 3.0  1.5 - 4.5 g/dL Final   Albumin/Globulin Ratio 01/06/2022 1.5  1.2 - 2.2 Final   Bilirubin Total 01/06/2022 <0.2  0.0 - 1.2 mg/dL Final   Alkaline Phosphatase 01/06/2022 84  44 - 121 IU/L Final   AST 01/06/2022 13  0 - 40 IU/L Final   ALT 01/06/2022 11  0 - 32 IU/L Final   Rheumatoid fact SerPl-aCnc 01/06/2022 <10.0  <14.0 IU/mL Final   WBC 01/06/2022 7.9  3.4 - 10.8 x10E3/uL Final   RBC 01/06/2022 4.90  3.77 - 5.28 x10E6/uL Final   Hemoglobin 01/06/2022 13.9  11.1 - 15.9 g/dL Final   Hematocrit 95/89/7976 42.3  34.0 - 46.6 % Final   MCV 01/06/2022 86  79 - 97 fL Final   MCH 01/06/2022 28.4  26.6 - 33.0 pg Final   MCHC 01/06/2022 32.9  31.5 - 35.7 g/dL Final   RDW 95/89/7976 13.0  11.7 - 15.4 % Final   Platelets 01/06/2022 268  150 - 450 x10E3/uL Final   Neutrophils 01/06/2022 57  Not Estab. % Final   Lymphs 01/06/2022 33  Not Estab. % Final   Monocytes 01/06/2022 7  Not Estab. % Final   Eos 01/06/2022 2  Not Estab. % Final   Basos 01/06/2022 1  Not Estab. % Final   Neutrophils Absolute 01/06/2022 4.5  1.4 - 7.0 x10E3/uL Final   Lymphocytes Absolute 01/06/2022 2.6  0.7 - 3.1 x10E3/uL Final   Monocytes Absolute 01/06/2022 0.6  0.1 - 0.9 x10E3/uL Final   EOS (ABSOLUTE) 01/06/2022 0.2  0.0 - 0.4 x10E3/uL Final   Basophils Absolute 01/06/2022 0.1  0.0 - 0.2 x10E3/uL Final   Immature Granulocytes 01/06/2022 0  Not Estab. % Final   Immature Grans (Abs) 01/06/2022 0.0  0.0 - 0.1 x10E3/uL Final    Allergies: Patient has no known allergies.  Medications:  PTA Medications  Medication Sig   albuterol  (PROVENTIL  HFA;VENTOLIN  HFA) 108 (90 Base) MCG/ACT inhaler Inhale 2 puffs into the lungs every 4 (four) hours as needed for wheezing or shortness of breath. (Patient not taking: Reported on 09/06/2021)   busPIRone  (BUSPAR ) 10 MG tablet Take 1 tablet (10 mg total) by  mouth 3 (three) times daily.   citalopram  (CELEXA ) 20 MG tablet Take 1 tablet (20 mg total) by mouth at bedtime.  amantadine  (SYMMETREL ) 100 MG capsule Take 1 capsule (100 mg total) by mouth 2 (two) times daily.   ARIPiprazole  (ABILIFY ) 20 MG tablet Take 1 tablet (20 mg total) by mouth at bedtime.   Triamcinolone  Acetonide 0.025 % LOTN Apply twice a day as needed   Facility Ordered Medications  Medication   acetaminophen  (TYLENOL ) tablet 650 mg   alum & mag hydroxide-simeth (MAALOX/MYLANTA) 200-200-20 MG/5ML suspension 30 mL   magnesium  hydroxide (MILK OF MAGNESIA) suspension 30 mL   haloperidol  (HALDOL ) tablet 5 mg   And   diphenhydrAMINE  (BENADRYL ) capsule 50 mg   haloperidol  lactate (HALDOL ) injection 5 mg   And   diphenhydrAMINE  (BENADRYL ) injection 50 mg   And   LORazepam  (ATIVAN ) injection 2 mg   haloperidol  lactate (HALDOL ) injection 10 mg   And   diphenhydrAMINE  (BENADRYL ) injection 50 mg   And   LORazepam  (ATIVAN ) injection 2 mg   hydrOXYzine  (ATARAX ) tablet 25 mg      Medical Decision Making  Admission to Observation unit for overnight monitoring. Reevaluation in AM with possible discharge.   Hydroxyzine  25 mg PO TID PRN.  Acetaminophen  650 mg PO Q 6 PRN Maalox 30 ml PO Q 4 PRN Milk of Magnesia 30 ml PO Daily PRN  Home medications:  Abilify  20 mg PO HS Buspar  10 mg PO TID Celexa  20 mg PO HS  Admission labs: CBC, CMP, TSH, A1C, UA, UDS, UPT, Ethanol, Magnesium , Lipid function Panel   EKG  Recommendations  Based on my evaluation the patient does not appear to have an emergency medical condition.  Randall Bouquet, NP 09/01/24  7:58 PM

## 2024-09-01 NOTE — ED Notes (Signed)
 Pt sleeping at present, no distress noted.  Monitoring for safety.
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
## 2024-09-01 NOTE — ED Notes (Signed)
 Mother dropped pt off at facility after argument with mother.  Pt reports she feel fine, denies SI, HI or AVH.  Calm & cooperative.  Monitoring for safety.

## 2024-09-02 NOTE — ED Notes (Signed)
 Patient resting in lounger with eyes closed, respirations even and unlabored. Patient in no apparent acute distress. Environment secured. Safety checks in place per facility protocol.

## 2024-09-02 NOTE — ED Notes (Signed)
 Pt sleeping at present, no distress noted.  Monitoring for safety.

## 2024-09-02 NOTE — ED Notes (Signed)
 Pt was provided lunch

## 2024-09-02 NOTE — ED Provider Notes (Signed)
 FBC/OBS ASAP Discharge Summary  Date and Time: 09/02/2024 11:29 AM  Name: Tracy Bond  MRN:  [id number]   Discharge Diagnoses:  Final diagnoses:  Schizoaffective disorder, bipolar type [facility])    Subjective: Tracy Bond 23 y.o., female patient presented to [facility] as a voluntary walk in yesterday with complaints of verbal aggression towards mother during an argument they had. Tracy Bond, is seen face to face by this provider and chart reviewed on 09/02/24.  Pt has PPHx of Schizoaffective disorder, bipolar type and intellectual disability. Medical history of asthma.  Patient currently being followed by [name] for medication management.  On evaluation Tracy Bond reports that she did sleep well and has a good appetite.  She denies current suicidal, homicidal and psychotic symptoms.  Patient reports that she did get into a verbal argument with her mother last night about wanting her to help apply for her own housing program.  She admits to being rude and respectful to her mother because she wanted it done right then and there as she is somewhat impatient.  Patient states that she is very remorseful and has spoke with mother since admission and has apologized.  Patient inquires about her Zoloft medication and states that she stopped taking Celexa  about a year ago and is taking Zoloft for depression and anxiety but did not get it this morning during medication pass.  She is compliant with her medication regimen at home.  Patient does endorse feeling anxious about the possibility of her getting into a group home.  She states that she is fearful that she will be far away from family.  Patient is calm and cooperative with no significant events or behavioral concerns overnight.  Patient expresses readiness to discharge back home and is hopeful that mom will allow her to return.  This clinical research associate also spoke with patient's mother [name] [phone] for discharge planning purposes.   Mother states: The patient did become upset about wanting to have her own place and go through [facility] for their housing program.  Mother states that she was explaining how it does take time and can be very difficult maintaining your own place.  She reports that patient became more more upset and eventually was yelling at her.  No physical aggression at that time.  Mother reports that she has started looking into applying for group homes for patient as she does not feel she can manage the behaviors at home.  Mother is currently at work but does agree to come pick patient up for discharge around 2 to 3 PM today.   During evaluation Tracy Bond is lying down in bed, in no acute distress. She is alert & oriented x 4, calm, cooperative and attentive for this assessment.  Her mood is somewhat anxious but euthymic with congruent affect.  She has normal speech, and behavior.  Objectively there is no evidence of psychosis/mania or delusional thinking. Pt does not appear to be responding to internal or external stimuli.  Patient is able to converse coherently, goal directed thoughts, no distractibility, or pre-occupation.  She also denies current suicidal/self-harm/homicidal ideation, psychosis, and paranoia.  Patient does endorse a history of hearing voices and seeing things but that the medication has been effective at minimizing this.  Patient answered assessment questions appropriately.      Stay Summary: 09/01/2024  Starlyn Patron, NP      Patient is evaluated face-to-face by this provider. Chart is reviewed on 09/01/2024.  She is  sitting in the assessment room alone. She is receptive and pleasant upon approach. She is appropriately dressed and groomed. Alert and oriented x 4. She appears healthy and well nourished. Does not appear to be preoccupied, or responding to internal stimuli. Patient maintains eye contact throughout this evaluation. Speech is clear, organized and well articulated. She denies  hallucinations. Denies suicidal/homicidal ideations, past or present. She does admit to hx of auditory and visual hallucinations, that's why I was diagnosed with Schizophrenia back in 2018.  Tracy Bond reports that she was sent here by her mother due to her inappropriateness at home. Admits to talking aggressively to her mother. Reports this was triggered by a conversation they had to discuss about housing plan. She does express remorse and states her mother may not allow her to go back home. She does report feeling anxious and somehow overwhelmed. Patient reports taking her medications as prescribed and she is able to describe her medication regimen. She does admit to experiencing anxiety and talking aggressively when I get upset. She expresses concerns about her potential placement at a group home, reporting that mom is trying to send her far away and I don't want to stay far from my family.   Patient reports that her father is is supportive and she often talks to him.  She reports that she sleeps well and appetite is good. Patient denies substance use, past or present.   Collateral information from patient's mother [name] [phone]: Tracy Bond has been talking nasty, having attitude and I wanted her to get evaluated and maybe stay overnight.. We are in the process of getting her into a group home, I know it takes long, she will come and stay with me until we find a group home.   Patient is admitted to Observation unit for overnight monitoring. She will be reevaluated in AM with possible discharge in the afternoon (mom works until 4 pm).   Total Time spent with patient: 20 minutes  Past Psychiatric History: Schizoaffective disorder, Bipolar type. Last inpatient admission was in 2021 at [facility]. Currently seeing Dr. Musa Akintayo for medication management.   Past Medical History: Asthma Family Psychiatric History: Aunt with MDD and GAD. Denies family hx of suicide. Social History: Pt currently  unemployed and living with her mother.  Tobacco Cessation:  N/A, patient does not currently use tobacco products  Current Medications:  Current Facility-Administered Medications  Medication Dose Route Frequency Provider Last Rate Last Admin   acetaminophen  (TYLENOL ) tablet 650 mg  650 mg Oral Q6H PRN Randall Starlyn HERO, NP       alum & mag hydroxide-simeth (MAALOX/MYLANTA) 200-200-20 MG/5ML suspension 30 mL  30 mL Oral Q4H PRN Randall, Veronique M, NP       ARIPiprazole  (ABILIFY ) tablet 20 mg  20 mg Oral QHS Byungura, Veronique M, NP   20 mg at 09/01/24 2141   busPIRone  (BUSPAR ) tablet 10 mg  10 mg Oral TID Byungura, Veronique M, NP   10 mg at 09/02/24 0820   haloperidol  (HALDOL ) tablet 5 mg  5 mg Oral TID PRN Randall Starlyn HERO, NP       And   diphenhydrAMINE  (BENADRYL ) capsule 50 mg  50 mg Oral TID PRN Randall Starlyn HERO, NP       haloperidol  lactate (HALDOL ) injection 5 mg  5 mg Intramuscular TID PRN Randall Starlyn HERO, NP       And   diphenhydrAMINE  (BENADRYL ) injection 50 mg  50 mg Intramuscular TID PRN Randall Starlyn HERO, NP  And   LORazepam  (ATIVAN ) injection 2 mg  2 mg Intramuscular TID PRN Randall Starlyn HERO, NP       haloperidol  lactate (HALDOL ) injection 10 mg  10 mg Intramuscular TID PRN Randall Starlyn HERO, NP       And   diphenhydrAMINE  (BENADRYL ) injection 50 mg  50 mg Intramuscular TID PRN Randall Starlyn HERO, NP       And   LORazepam  (ATIVAN ) injection 2 mg  2 mg Intramuscular TID PRN Randall Starlyn HERO, NP       hydrOXYzine  (ATARAX ) tablet 25 mg  25 mg Oral TID PRN Randall Starlyn HERO, NP   25 mg at 09/01/24 2141   magnesium  hydroxide (MILK OF MAGNESIA) suspension 30 mL  30 mL Oral Daily PRN Randall Starlyn HERO, NP       Current Outpatient Medications  Medication Sig Dispense Refill   albuterol  (PROVENTIL  HFA;VENTOLIN  HFA) 108 (90 Base) MCG/ACT inhaler Inhale 2 puffs into the lungs every 4 (four) hours as needed for wheezing or  shortness of breath. 1 Inhaler 3   amantadine  (SYMMETREL ) 100 MG capsule Take 1 capsule (100 mg total) by mouth 2 (two) times daily. 7 capsule 0   ARIPiprazole  (ABILIFY ) 20 MG tablet Take 1 tablet (20 mg total) by mouth at bedtime. 30 tablet 0   busPIRone  (BUSPAR ) 10 MG tablet Take 1 tablet (10 mg total) by mouth 3 (three) times daily. 21 tablet 0   sertraline (ZOLOFT) 50 MG tablet Take 50 mg by mouth daily.     Triamcinolone  Acetonide 0.025 % LOTN Apply twice a day as needed (Patient taking differently: Apply 1 Application topically 2 (two) times daily as needed (Apply to affected area).) 60 mL 6    PTA Medications:  Facility Ordered Medications  Medication   acetaminophen  (TYLENOL ) tablet 650 mg   alum & mag hydroxide-simeth (MAALOX/MYLANTA) 200-200-20 MG/5ML suspension 30 mL   magnesium  hydroxide (MILK OF MAGNESIA) suspension 30 mL   haloperidol  (HALDOL ) tablet 5 mg   And   diphenhydrAMINE  (BENADRYL ) capsule 50 mg   haloperidol  lactate (HALDOL ) injection 5 mg   And   diphenhydrAMINE  (BENADRYL ) injection 50 mg   And   LORazepam  (ATIVAN ) injection 2 mg   haloperidol  lactate (HALDOL ) injection 10 mg   And   diphenhydrAMINE  (BENADRYL ) injection 50 mg   And   LORazepam  (ATIVAN ) injection 2 mg   hydrOXYzine  (ATARAX ) tablet 25 mg   ARIPiprazole  (ABILIFY ) tablet 20 mg   busPIRone  (BUSPAR ) tablet 10 mg   PTA Medications  Medication Sig   albuterol  (PROVENTIL  HFA;VENTOLIN  HFA) 108 (90 Base) MCG/ACT inhaler Inhale 2 puffs into the lungs every 4 (four) hours as needed for wheezing or shortness of breath.   busPIRone  (BUSPAR ) 10 MG tablet Take 1 tablet (10 mg total) by mouth 3 (three) times daily.   amantadine  (SYMMETREL ) 100 MG capsule Take 1 capsule (100 mg total) by mouth 2 (two) times daily.   ARIPiprazole  (ABILIFY ) 20 MG tablet Take 1 tablet (20 mg total) by mouth at bedtime.   Triamcinolone  Acetonide 0.025 % LOTN Apply twice a day as needed (Patient taking differently: Apply 1  Application topically 2 (two) times daily as needed (Apply to affected area).)   sertraline (ZOLOFT) 50 MG tablet Take 50 mg by mouth daily.       09/01/2024    7:57 PM 01/06/2022    3:45 PM  Depression screen PHQ 2/9  Decreased Interest 0 0  Down, Depressed, Hopeless 0 0  PHQ -  2 Score 0 0    Flowsheet Row ED from 09/01/2024 in [facility] ED from 07/13/2021 in [facility] ED from 07/12/2021 in [facility]  C-SSRS RISK CATEGORY No Risk High Risk No Risk    Musculoskeletal  Strength & Muscle Tone: within normal limits Gait & Station: normal Patient leans: N/A  Psychiatric Specialty Exam  Presentation  General Appearance:  Appropriate for Environment  Eye Contact: Good  Speech: Clear and Coherent  Speech Volume: Normal  Handedness: Right   Mood and Affect  Mood: Anxious  Affect: Congruent   Thought Process  Thought Processes: Coherent  Descriptions of Associations:Intact  Orientation:Full (Time, Place and Person)  Thought Content:WDL  Diagnosis of Schizophrenia or Schizoaffective disorder in past: Yes  Duration of Psychotic Symptoms: No data recorded  Hallucinations:Hallucinations: None  Ideas of Reference:None  Suicidal Thoughts:Suicidal Thoughts: No  Homicidal Thoughts:Homicidal Thoughts: No   Sensorium  Memory: Immediate Fair; Recent Fair; Remote Fair  Judgment: Fair  Insight: Fair   Art Therapist  Concentration: Fair  Attention Span: Fair  Recall: [employer] of Knowledge: Fair  Language: Fair   Psychomotor Activity  Psychomotor Activity: Psychomotor Activity: Normal   Assets  Assets: Communication Skills; Desire for Improvement; Physical Health; Social Support   Sleep  Sleep: Sleep: Good  No Safety Checks orders active in given range  Nutritional Assessment (For OBS and [facility] admissions only) Has the patient had a  weight loss or gain of 10 pounds or more in the last 3 months?: No Has the patient had a decrease in food intake/or appetite?: No Does the patient have dental problems?: No Does the patient have eating habits or behaviors that may be indicators of an eating disorder including binging or inducing vomiting?: No Has the patient recently lost weight without trying?: 0 Has the patient been eating poorly because of a decreased appetite?: 0 Malnutrition Screening Tool Score: 0    Physical Exam  Physical Exam Vitals and nursing note reviewed.  Constitutional:      Appearance: Normal appearance.  HENT:     Head: Normocephalic.     Nose: Nose normal.  Eyes:     Extraocular Movements: Extraocular movements intact.  Cardiovascular:     Rate and Rhythm: Normal rate.  Pulmonary:     Effort: Pulmonary effort is normal.  Musculoskeletal:        General: Normal range of motion.     Cervical back: Normal range of motion.  Neurological:     General: No focal deficit present.     Mental Status: She is alert and oriented to person, place, and time.    Review of Systems  Constitutional: Negative.   HENT: Negative.    Eyes: Negative.   Respiratory: Negative.    Cardiovascular: Negative.   Gastrointestinal: Negative.   Genitourinary: Negative.   Musculoskeletal: Negative.   Neurological: Negative.   Endo/Heme/Allergies: Negative.   Psychiatric/Behavioral: Negative.     Blood pressure 114/73, pulse 64, temperature 97.6 F (36.4 C), temperature source Oral, resp. rate 18, SpO2 98%. There is no height or weight on file to calculate BMI.  Demographic Factors:  Adolescent or young adult  Loss Factors: NA  Historical Factors: Impulsivity  Risk Reduction Factors:   Sense of responsibility to family, Living with another person, especially a relative, Positive social support, and Positive therapeutic relationship  Continued Clinical Symptoms:  Schizophrenia:   Less than 45 years  old  Cognitive Features  That Contribute To Risk:  Polarized thinking    Suicide Risk:  Minimal: No identifiable suicidal ideation.  Patients presenting with no risk factors but with morbid ruminations; may be classified as minimal risk based on the severity of the depressive symptoms  Plan Of Care/Follow-up recommendations:  Pt is discharged from [facility] after overnight assessment. Pt was brought in by mother after a verbal argument in which pt was very disrespectful towards mother. No physical aggression reported. Pt states that she has since spoke with mother and apologized. Pt denies SI, HI and psychotic symptoms. No behavioral concerns or events since admission. Pt compliant with her medications. Spoke with mother who agrees to patient discharging home with her and denies safety concerns.  - Pt will follow up with psychiatrist, Dr. Musa Akintayo for ongoing medication management.  - No medication adjustments made as there was no concerns indicating need for medication adjustment during this visit.  - Recommend pt follow up with outpatient therapy to help with emotional regulation and coping skills.   Disposition: Discharge home with mom.   Alan JAYSON Mcardle, NP 09/02/2024, 11:29 AM

## 2024-09-02 NOTE — Discharge Instructions (Signed)
 Discharge recommendations:   Medications: No medication adjustments at this visit. Patient is to take medications as prescribed. The patient or patient's guardian is to contact a medical professional and/or outpatient provider to address any new side effects that develop. The patient or the patient's guardian should update outpatient providers of any new medications and/or medication changes.    Outpatient Follow up: Please follow up with [name].  Please follow up with your primary care provider for all medical related needs.    Therapy: We recommend that patient participate in individual therapy to address mental health concerns.   Atypical antipsychotics: If you are prescribed an atypical antipsychotic, it is recommended that your height, weight, BMI, blood pressure, fasting lipid panel, and fasting blood sugar be monitored by your outpatient providers.  Safety:   The following safety precautions should be taken:   No sharp objects. This includes scissors, razors, scrapers, and putty knives.   Chemicals should be removed and locked up.   Medications should be removed and locked up.   Weapons should be removed and locked up. This includes firearms, knives and instruments that can be used to cause injury.   The patient should abstain from use of illicit substances/drugs and abuse of any medications.  If symptoms worsen or do not continue to improve or if the patient becomes actively suicidal or homicidal then it is recommended that the patient return to the closest hospital emergency department, the [facility], or call 911 for further evaluation and treatment. National Suicide Prevention Lifeline 1-800-SUICIDE or 2720485772.  About 988 988 offers 24/7 access to trained crisis counselors who can help people experiencing mental health-related distress. People can call or text 988 or chat 988lifeline.org for themselves or if they are worried about a  loved one who may need crisis support.   You are encouraged to follow up with [facility] for outpatient treatment.  Walk in/ Open Access Hours: Monday - Friday 8AM - 10AM (To see provider and therapist) Please arrive by 7AM for walk in assessment  [facility] (second floor) [address] [place], [place] [phone]

## 2024-09-02 NOTE — ED Notes (Signed)
 Patient sitting in lounger. Calm, collected, no physical complaints at this time. Patient in no apparent acute distress. No inappropriate behaviors observed or reported at this time. Environment secured. Safety checks in place per facility protocol.

## 2024-09-02 NOTE — ED Notes (Signed)
 Pts om arrived for patients discharge. AVS reviewed with patient and MOM.  Bothe verbalized with undderstanding and appreciation. Pt discharged from [facility].

## 2024-09-02 NOTE — ED Notes (Signed)
 Patient alert & oriented x4. Denies intent to harm self or others when asked. Denies A/VH. Patient denies any physical complaints when asked. No acute distress noted. Scheduled medications administered with no complications. Support and encouragement provided. Patient observed in milieu. No inappropriate behaviors observed or reported. Patient asked clinical research associate about Zoloft which she states she takes in the morning, clinical research associate to make provider aware. Patient planned to discharge today after 1600 when mother is available to pick up patient, patient aware of plan and voices no concerns at this time. Routine safety checks conducted per facility protocol. Encouraged patient to notify staff if any thoughts of harm towards self or others arise. Patient verbalizes understanding and agreement.

## 2024-09-10 ENCOUNTER — Ambulatory Visit (HOSPITAL_COMMUNITY)
Admission: EM | Admit: 2024-09-10 | Discharge: 2024-09-11 | Disposition: A | Discharge status: Home / Self Care | Payer: MEDICAID | Attending: Urology | Admitting: Urology

## 2024-09-10 DIAGNOSIS — R4689 Other symptoms and signs involving appearance and behavior: Secondary | ICD-10-CM

## 2024-09-10 DIAGNOSIS — F250 Schizoaffective disorder, bipolar type: Secondary | ICD-10-CM

## 2024-09-10 LAB — CBC WITH DIFFERENTIAL/PLATELET
Abs Immature Granulocytes: 0.03 K/uL (ref 0.00–0.07)
Basophils Absolute: 0.1 K/uL (ref 0.0–0.1)
Basophils Relative: 1 %
Eosinophils Absolute: 0.1 K/uL (ref 0.0–0.5)
Eosinophils Relative: 1 %
HCT: 46.1 % — ABNORMAL HIGH (ref 36.0–46.0)
Hemoglobin: 14.8 g/dL (ref 12.0–15.0)
Immature Granulocytes: 0 %
Lymphocytes Relative: 35 %
Lymphs Abs: 3.6 K/uL (ref 0.7–4.0)
MCH: 28.4 pg (ref 26.0–34.0)
MCHC: 32.1 g/dL (ref 30.0–36.0)
MCV: 88.3 fL (ref 80.0–100.0)
Monocytes Absolute: 0.5 K/uL (ref 0.1–1.0)
Monocytes Relative: 5 %
Neutro Abs: 6.1 K/uL (ref 1.7–7.7)
Neutrophils Relative %: 58 %
Platelets: 263 K/uL (ref 150–400)
RBC: 5.22 MIL/uL — ABNORMAL HIGH (ref 3.87–5.11)
RDW: 13.6 % (ref 11.5–15.5)
WBC: 10.3 K/uL (ref 4.0–10.5)
nRBC: 0 % (ref 0.0–0.2)

## 2024-09-10 LAB — COMPREHENSIVE METABOLIC PANEL WITH GFR
ALT: 18 U/L (ref 0–44)
AST: 20 U/L (ref 15–41)
Albumin: 4 g/dL (ref 3.5–5.0)
Alkaline Phosphatase: 78 U/L (ref 38–126)
Anion gap: 14 (ref 5–15)
BUN: 8 mg/dL (ref 6–20)
CO2: 24 mmol/L (ref 22–32)
Calcium: 9.6 mg/dL (ref 8.9–10.3)
Chloride: 102 mmol/L (ref 98–111)
Creatinine, Ser: 0.83 mg/dL (ref 0.44–1.00)
GFR, Estimated: >60 mL/min (ref >60)
Glucose, Bld: 78 mg/dL (ref 70–99)
Potassium: 4.5 mmol/L (ref 3.5–5.1)
Sodium: 140 mmol/L (ref 135–145)
Total Bilirubin: 0.6 mg/dL (ref 0.0–1.2)
Total Protein: 8.4 g/dL — ABNORMAL HIGH (ref 6.5–8.1)

## 2024-09-10 LAB — POCT URINE DRUG SCREEN - MANUAL ENTRY (I-SCREEN)
POC Amphetamine UR: NOT DETECTED
POC Buprenorphine (BUP): NOT DETECTED
POC Cocaine UR: NOT DETECTED
POC Marijuana UR: NOT DETECTED
POC Methadone UR: NOT DETECTED
POC Methamphetamine UR: NOT DETECTED
POC Morphine: NOT DETECTED
POC Oxazepam (BZO): NOT DETECTED
POC Oxycodone UR: NOT DETECTED
POC Secobarbital (BAR): NOT DETECTED

## 2024-09-10 LAB — POC URINE PREG, ED: Preg Test, Ur: NEGATIVE

## 2024-09-10 LAB — ETHANOL: Alcohol, Ethyl (B): <15 mg/dL (ref <15)

## 2024-09-10 MED ORDER — HALOPERIDOL LACTATE 5 MG/ML IJ SOLN: 10.0000 mg | Freq: Three times a day (TID) | INTRAMUSCULAR | Status: DC | PRN

## 2024-09-10 MED ORDER — ARIPIPRAZOLE 10 MG PO TABS
20.0000 mg | ORAL_TABLET | Freq: Every day | ORAL | Status: DC
  Administered 2024-09-10: 20 mg via ORAL
  Filled 2024-09-10: qty 2

## 2024-09-10 MED ORDER — HALOPERIDOL 5 MG PO TABS: 5.0000 mg | ORAL_TABLET | Freq: Three times a day (TID) | ORAL | Status: DC | PRN

## 2024-09-10 MED ORDER — TRAZODONE HCL 50 MG PO TABS: 50.0000 mg | ORAL_TABLET | Freq: Every evening | ORAL | Status: DC | PRN

## 2024-09-10 MED ORDER — HALOPERIDOL LACTATE 5 MG/ML IJ SOLN: 5.0000 mg | Freq: Three times a day (TID) | INTRAMUSCULAR | Status: DC | PRN

## 2024-09-10 MED ORDER — LORAZEPAM 2 MG/ML IJ SOLN: 2.0000 mg | Freq: Three times a day (TID) | INTRAMUSCULAR | Status: DC | PRN

## 2024-09-10 MED ORDER — ALUM & MAG HYDROXIDE-SIMETH 200-200-20 MG/5ML PO SUSP: 30.0000 mL | ORAL | Status: DC | PRN

## 2024-09-10 MED ORDER — DIPHENHYDRAMINE HCL 50 MG/ML IJ SOLN: 50.0000 mg | Freq: Three times a day (TID) | INTRAMUSCULAR | Status: DC | PRN

## 2024-09-10 MED ORDER — ACETAMINOPHEN 325 MG PO TABS: 650.0000 mg | ORAL_TABLET | Freq: Four times a day (QID) | ORAL | Status: DC | PRN

## 2024-09-10 MED ORDER — CITALOPRAM HYDROBROMIDE 20 MG PO TABS
20.0000 mg | ORAL_TABLET | Freq: Every day | ORAL | Status: DC
  Filled 2024-09-10: qty 1

## 2024-09-10 MED ORDER — BUSPIRONE HCL 10 MG PO TABS
10.0000 mg | ORAL_TABLET | Freq: Three times a day (TID) | ORAL | Status: DC
  Administered 2024-09-10 – 2024-09-11 (×2): 10 mg via ORAL
  Filled 2024-09-10 (×2): qty 1

## 2024-09-10 MED ORDER — HYDROXYZINE HCL 25 MG PO TABS: 25.0000 mg | ORAL_TABLET | Freq: Three times a day (TID) | ORAL | Status: DC | PRN

## 2024-09-10 MED ORDER — DIPHENHYDRAMINE HCL 50 MG PO CAPS: 50.0000 mg | ORAL_CAPSULE | Freq: Three times a day (TID) | ORAL | Status: DC | PRN

## 2024-09-10 MED ORDER — MAGNESIUM HYDROXIDE 400 MG/5ML PO SUSP: 30.0000 mL | Freq: Every day | ORAL | Status: DC | PRN

## 2024-09-10 NOTE — ED Notes (Addendum)
 Patient is alert and oriented x 4 with no acute distress noted.  Patient arrived calm and cooperative at this time, and she currently denies SI/HI/AVH at this time.  Patient was offered food and drink during shift.  Patient was compliant with scheduled medications during shift.  Patient also called her mom during shift.  Skin assessment completed, patient has open area on upper back below neck.  Area was covered with saline and bandaid placed.  Patient denies any pain/discomfort at this time.  No issues or concerns voiced at this time.  Will continue Q 15 min safety checks per facility protocol.

## 2024-09-10 NOTE — Progress Notes (Signed)
 Patient is currently resting at this time.  No acute distress noted.  Respirations present, even and unlabored.  No issues or behaviors noted at this time.  Will continue Q 15 min safety checks for safety/behavior per facility protocol.
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
## 2024-09-10 NOTE — Progress Notes (Signed)
°   09/10/24 1849  [facility] Triage Screening (Walk-ins at [facility] only)  What Is the Reason for Your Visit/Call Today? Patient is a 23 year old female who presents to voluntarily unaccompanied.  Patient states that she and her mom got into an argument earlier with mom.  Patient states that she does not want to go back home to live with mom.  However she has nowhere else to go because she feels that dad does not want her either and her aunt lives in [place]  that she has no way to get to her.  Patient denies any SI HI or AVH.  Patient reports diagnosis of anxiety and that she is compliant in taking her medications.  Patient is cheerful and at times will still stating that she really does not want to talk about her situation.  How Long Has This Been Causing You Problems? <Week  Have You Recently Had Any Thoughts About Hurting Yourself? No  Are You Planning to Commit Suicide/Harm Yourself At This time? No  Have you Recently Had Thoughts About Hurting Someone [name]? No  Are You Planning To Harm Someone At This Time? No  Explanation: n/a  Physical Abuse Denies  Verbal Abuse Denies  Sexual Abuse Denies  Exploitation of patient/patient's resources Denies  Self-Neglect Denies  Possible abuse reported to:  (n/a)  Are you currently experiencing any auditory, visual or other hallucinations? No  Have You Used Any Alcohol or Drugs in the Past 24 Hours? No  Do you have any current medical co-morbidities that require immediate attention? No  Clinician description of patient physical appearance/behavior: tearful, hostile

## 2024-09-11 MED ORDER — AMANTADINE HCL 100 MG PO CAPS
100.0000 mg | ORAL_CAPSULE | Freq: Two times a day (BID) | ORAL | Status: DC
  Administered 2024-09-11: 100 mg via ORAL
  Filled 2024-09-11: qty 1

## 2024-09-11 MED ORDER — SERTRALINE HCL 50 MG PO TABS
50.0000 mg | ORAL_TABLET | Freq: Every day | ORAL | Status: DC
  Administered 2024-09-11: 50 mg via ORAL
  Filled 2024-09-11: qty 1

## 2024-09-11 NOTE — ED Notes (Signed)
 Patient discharged home per provider order. After Visit Summary (AVS) printed and given to patient. AVS reviewed with patient and all questions fully answered. Patient discharged in no acute distress, A& O x4 and ambulatory. Patient denied SI/HI, A/VH upon discharge. Patient verbalized understanding of all discharge instructions explained by staff. Patient mood fair. Patient belongings returned to patient from locker #29 complete and intact. Patient escorted to lobby via staff for transport to destination. Safety maintained.

## 2024-09-11 NOTE — ED Provider Notes (Signed)
 FBC/OBS ASAP Discharge Summary  Date and Time: 09/11/2024 9:37 AM  Name: Tracy Bond  MRN:  [id number]   Discharge Diagnoses:  Final diagnoses:  Schizoaffective disorder, bipolar type [facility])  Aggression    Subjective: Because me and my mama had something going on   Stay Summary: Per triage on 12/13, Patient is a 23 year old female who presents to voluntarily unaccompanied. Patient states that she and her mom got into an argument earlier with mom. Patient states that she does not want to go back home to live with mom. However she has nowhere else to go because she feels that dad does not want her either and her aunt lives in [place]  that she has no way to get to her. Patient denies any SI HI or AVH. Patient reports diagnosis of anxiety and that she is compliant in taking her medications. Patient is cheerful and at times will still stating that she really does not want to talk about her situation. Pt admits that she got mad at home and had gone to the kitchen and grabbed a knofe but she is quick ot point out that she didnto hurt anyone or herself. My mother wanted to make sure I was evaluated. P tdenie sany guns being in the home. Pt deneis any access to ETOH or other substances. Pt is aware that she will be staying overnight. Pt is follewed by [name] and her next appt is on 12/19.   Pt is seen face-to-face on the [facility] Adult treatment area. Pt is alert & oriented x 4 and engages in today's visit. Pt states she was brought to this facility because me and my mama had something going on. Pt states I got mad and went into the kitchen and grabbed some knives. States she had a knife in each hand but I didn't hurt nobody and I put it down when my mama told me to. She denies wanting to hurt herself or anyone else when she grabbed the knives stating I was so mad. She displays low frustration tolerance and becomes quite irritable when discussing events that led to her coming here   and discussing perceived stressors. She is future-oriented and and verbalizes that she has an upcoming appointment with her outpatient psychiatrist. She denies suicidal or homicidal ideation, intent or plan today. She denies AVH. She has been appropriate on the unit overnight and has not required any PRN medication for agitation. She does not display symptoms of psychosis/mania, delusional thinking, or significant mental health impairment beyond her baseline.  She is able to converse coherently, with goal directed thoughts. She has responded appropriately to questions and has remained cooperative throughout assessment.     Spoke with patient's mother, [name] [phone]) who states that patient and been very irritable, very aggressive and very angry that has worsened over the past 2 weeks. Mother states that the patient is wanting to move out into her own housing, however, states the patient does not understand this takes time due to patient being low income and not working. Mother states she is patient's payee and would assist patient with paying the rent and other bills. Mother states she is supportive of the patient moving out, however, states the patient's father doesn't think it is a good idea and is against the patient moving into her own housing. Mother states the patient does not have an ACT team, but that she is connected with [employer] for job training. States patient previously attended a day program in Sept,  2025 through [facility] however the patient only attended for a couple of weeks due to it being not a good fit. Mother states she would like for the patient to attend another day program and states she will get patient back into therapy as well. States that pt has been adherent with prescribed psychotropic medications and despite this mood dysregulation has persisted. Mother states the patient has an appt with outpatient psychiatrist, Dr Jan Donath on 12/19. Mother states  this will be an in-person appointment and she plans to accompany the patient to this appt to make her concerns regarding the patient's behavior known. Mother advises that she is on her way to church and will pick up patient after 1330 today.   Total Time spent with patient: 10 mins  Past Psychiatric History: Schizoaffective disorder, bipolar type, Intellectual disability (IQ 9 per psychological evaluation completed by Dr Dannielle at [facility] Hospitalization: [facility] (Nov, 2021, July, 2019) Past Medical History: Asthma Family History: family history is not on file.  Family Psychiatric History: none reported Social History: Graduated high school in 2018. Unemployed. Lives with her mother.  Tobacco Cessation:  N/A, patient does not currently use tobacco products  Current Medications:  Current Facility-Administered Medications  Medication Dose Route Frequency Provider Last Rate Last Admin   acetaminophen  (TYLENOL ) tablet 650 mg  650 mg Oral Q6H PRN Ajibola, [name] A, NP       alum & mag hydroxide-simeth (MAALOX/MYLANTA) 200-200-20 MG/5ML suspension 30 mL  30 mL Oral Q4H PRN Ajibola, [name] A, NP       ARIPiprazole  (ABILIFY ) tablet 20 mg  20 mg Oral QHS Ajibola, [name] A, NP   20 mg at 09/10/24 2139   busPIRone  (BUSPAR ) tablet 10 mg  10 mg Oral TID Ajibola, [name] A, NP   10 mg at 09/11/24 9083   citalopram  (CELEXA ) tablet 20 mg  20 mg Oral Daily Ajibola, [name] A, NP       haloperidol  (HALDOL ) tablet 5 mg  5 mg Oral TID PRN Ajibola, [name] A, NP       And   diphenhydrAMINE  (BENADRYL ) capsule 50 mg  50 mg Oral TID PRN Ajibola, [name] A, NP       haloperidol  lactate (HALDOL ) injection 5 mg  5 mg Intramuscular TID PRN Ajibola, [name] A, NP       And   diphenhydrAMINE  (BENADRYL ) injection 50 mg  50 mg Intramuscular TID PRN Ajibola, [name] A, NP       And   LORazepam  (ATIVAN ) injection 2 mg  2 mg Intramuscular TID PRN Ajibola, [name] A, NP       haloperidol  lactate (HALDOL ) injection 10 mg  10 mg Intramuscular TID PRN Ajibola,  [name] A, NP       And   diphenhydrAMINE  (BENADRYL ) injection 50 mg  50 mg Intramuscular TID PRN Ajibola, [name] A, NP       And   LORazepam  (ATIVAN ) injection 2 mg  2 mg Intramuscular TID PRN Ajibola, [name] A, NP       hydrOXYzine  (ATARAX ) tablet 25 mg  25 mg Oral TID PRN Ajibola, [name] A, NP       magnesium  hydroxide (MILK OF MAGNESIA) suspension 30 mL  30 mL Oral Daily PRN Ajibola, [name] A, NP       traZODone  (DESYREL ) tablet 50 mg  50 mg Oral QHS PRN Ajibola, [name] A, NP       Current Outpatient Medications  Medication Sig Dispense Refill   albuterol  (PROVENTIL  HFA;VENTOLIN   HFA) 108 (90 Base) MCG/ACT inhaler Inhale 2 puffs into the lungs every 4 (four) hours as needed for wheezing or shortness of breath. 1 Inhaler 3   amantadine  (SYMMETREL ) 100 MG capsule Take 1 capsule (100 mg total) by mouth 2 (two) times daily. 7 capsule 0   ARIPiprazole  (ABILIFY ) 20 MG tablet Take 1 tablet (20 mg total) by mouth at bedtime. 30 tablet 0   busPIRone  (BUSPAR ) 10 MG tablet Take 1 tablet (10 mg total) by mouth 3 (three) times daily. 21 tablet 0   sertraline  (ZOLOFT ) 50 MG tablet Take 50 mg by mouth daily.     Triamcinolone  Acetonide 0.025 % LOTN Apply twice a day as needed (Patient taking differently: Apply 1 Application topically 2 (two) times daily as needed (Apply to affected area).) 60 mL 6    PTA Medications:  Facility Ordered Medications  Medication   acetaminophen  (TYLENOL ) tablet 650 mg   alum & mag hydroxide-simeth (MAALOX/MYLANTA) 200-200-20 MG/5ML suspension 30 mL   magnesium  hydroxide (MILK OF MAGNESIA) suspension 30 mL   haloperidol  (HALDOL ) tablet 5 mg   And   diphenhydrAMINE  (BENADRYL ) capsule 50 mg   haloperidol  lactate (HALDOL ) injection 5 mg   And   diphenhydrAMINE  (BENADRYL ) injection 50 mg   And   LORazepam  (ATIVAN ) injection 2 mg   haloperidol  lactate (HALDOL ) injection 10 mg   And   diphenhydrAMINE  (BENADRYL ) injection 50 mg   And   LORazepam  (ATIVAN ) injection 2 mg   hydrOXYzine   (ATARAX ) tablet 25 mg   traZODone  (DESYREL ) tablet 50 mg   busPIRone  (BUSPAR ) tablet 10 mg   ARIPiprazole  (ABILIFY ) tablet 20 mg   citalopram  (CELEXA ) tablet 20 mg   PTA Medications  Medication Sig   albuterol  (PROVENTIL  HFA;VENTOLIN  HFA) 108 (90 Base) MCG/ACT inhaler Inhale 2 puffs into the lungs every 4 (four) hours as needed for wheezing or shortness of breath.   busPIRone  (BUSPAR ) 10 MG tablet Take 1 tablet (10 mg total) by mouth 3 (three) times daily.   amantadine  (SYMMETREL ) 100 MG capsule Take 1 capsule (100 mg total) by mouth 2 (two) times daily.   ARIPiprazole  (ABILIFY ) 20 MG tablet Take 1 tablet (20 mg total) by mouth at bedtime.   Triamcinolone  Acetonide 0.025 % LOTN Apply twice a day as needed (Patient taking differently: Apply 1 Application topically 2 (two) times daily as needed (Apply to affected area).)   sertraline  (ZOLOFT ) 50 MG tablet Take 50 mg by mouth daily.       09/01/2024    7:57 PM 01/06/2022    3:45 PM  Depression screen PHQ 2/9  Decreased Interest 0 0  Down, Depressed, Hopeless 0 0  PHQ - 2 Score 0 0    Flowsheet Row ED from 09/10/2024 in [facility] ED from 09/01/2024 in [facility] ED from 07/13/2021 in [facility]  C-SSRS RISK CATEGORY No Risk No Risk High Risk    Musculoskeletal  Strength & Muscle Tone: within normal limits Gait & Station: normal Patient leans: N/A  Psychiatric Specialty Exam  Presentation  General Appearance:  Appropriate for Environment  Eye Contact: Good  Speech: Clear and Coherent  Speech Volume: Normal  Handedness: Right   Mood and Affect  Mood: Anxious; Irritable  Affect: Congruent   Thought Process  Thought Processes: Coherent  Descriptions of Associations:Intact  Orientation:Full (Time, Place and Person)  Thought Content:Paranoid Ideation  Diagnosis of Schizophrenia or Schizoaffective disorder in past: Yes   Duration  of Psychotic Symptoms: No data recorded  Hallucinations:Hallucinations: None  Ideas of Reference:None  Suicidal Thoughts:Suicidal Thoughts: No  Homicidal Thoughts:Homicidal Thoughts: No   Sensorium  Memory: Immediate Fair; Recent Fair; Remote Fair  Judgment: Poor  Insight: Poor   Executive Functions  Concentration: Fair  Attention Span: Fair  Recall: Fair  Fund of Knowledge: Fair  Language: Fair   Psychomotor Activity  Psychomotor Activity:No data recorded  Assets  Assets: Desire for Improvement; Communication Skills; Housing; Physical Health; Social Support; Transportation   Sleep  Sleep: Sleep: Good  No Safety Checks orders active in given range  Nutritional Assessment (For OBS and FBC admissions only) Has the patient had a weight loss or gain of 10 pounds or more in the last 3 months?: No Has the patient had a decrease in food intake/or appetite?: No Does the patient have dental problems?: No Does the patient have eating habits or behaviors that may be indicators of an eating disorder including binging or inducing vomiting?: No Has the patient recently lost weight without trying?: 0 Has the patient been eating poorly because of a decreased appetite?: 0 Malnutrition Screening Tool Score: 0    Physical Exam  Physical Exam Vitals and nursing note reviewed.  HENT:     Mouth/Throat:     Mouth: Mucous membranes are moist.  Cardiovascular:     Rate and Rhythm: Normal rate.  Pulmonary:     Effort: Pulmonary effort is normal.  Musculoskeletal:        General: Normal range of motion.  Skin:    General: Skin is warm and dry.  Neurological:     Mental Status: She is alert and oriented to person, place, and time.  Psychiatric:     Comments: See HPI    Review of Systems  Constitutional:  Negative for chills and fever.  Respiratory:  Negative for cough and shortness of breath.   Cardiovascular:  Negative for chest pain and  palpitations.  Psychiatric/Behavioral:  Positive for depression. Negative for hallucinations, substance abuse and suicidal ideas. The patient is nervous/anxious.    Blood pressure 124/81, pulse 85, temperature 98.2 F (36.8 C), temperature source Tympanic, resp. rate 18, SpO2 99%. There is no height or weight on file to calculate BMI.  Demographic Factors:  Low socioeconomic status and Unemployed  Loss Factors: NA  Historical Factors: Impulsivity  Risk Reduction Factors:   Living with another person, especially a relative, Positive social support, and Positive therapeutic relationship  Continued Clinical Symptoms:  Schizoaffective disorder, bipolar type; anxiety  Cognitive Features That Contribute To Risk:  Polarized thinking    Suicide Risk:  Minimal: No identifiable suicidal ideation.  Patients presenting with no risk factors but with morbid ruminations; may be classified as minimal risk based on the severity of the depressive symptoms  Plan Of Care/Follow-up recommendations:  Activity:  as tolerated Diet:  Regular Tests:  as determined by outpatient PCP and/or mental health provider  Disposition: Continue home medications as previously prescribed. Discharge home with mother.    Sherrell Culver, PMHNP-BC, FNP-BC  09/11/2024, 9:37 AM

## 2024-09-11 NOTE — ED Notes (Signed)
 Patient alert and oriented.  Denies SI, HI, AVH, and pain. Scheduled medications administered to patient, per MD orders. Support and encouragement provided.  Routine safety checks conducted every 15 min.  Patient informed to notify staff with problems or concerns. No adverse drug reactions noted. Patient contracts for safety at this time. Patient compliant with medications and treatment plan. Patient receptive, calm, and cooperative. Patient interacts well with others on the unit.  Patient remains safe at this time.

## 2024-09-11 NOTE — ED Provider Notes (Signed)
 [facility] Continuous Assessment Admission H&P  Date: 09/11/2024 Patient Name: Tracy Bond MRN: [id number] Chief Complaint: My mom and I was arguing  Diagnoses:  Final diagnoses:  Schizoaffective disorder, bipolar type (HCC)  Aggression    HPI: Tracy Bond is 23 y/o female with hx of Schizoaffective disorder, bipolar type and intellectual disability who presented voluntarily to [facility] for evaluation of worsening aggression and reported hallucinations.   Patient seen face to face and her chart reviewed by this NP. During assessment, patient reports that she got into an argument with her mother after she believed she overheard her father calling her retarded during a phone conversation with her mother. patient described frequent arguments with her family, which she reports are causing her significant distress. patient also expressed frustration related to her desire to move out of her mother's home; she says that she believes her mother is obstructing her ability to do so, which has contributed to increased anger and relationship conflict with her mother. She denied suicidal ideation, homicidal ideation. She denied suicidal ideation, homicidal ideation, and visual hallucinations at the time of assessment and reported compliance with her home medications.  Collateral information was obtained from the patient's mother Juris Rumble [phone]), who reported that the patient's behaviors have progressively worsened since she was discharge over the past 2 weeks. Pt's mother described increasing verbal aggression toward family members, frequent anger outbursts throughout the week, and use of profanity, which she stated is a change from the patient's baseline behavior. Pt's mother report patient is frustrated because she wants to move out and the process is taking longer than expected. She expressed concern that the patient is spiraling out of control. Patient's mother also reports that   patient has been experiencing auditory hallucinations but  will not disclose this to healthcare providers due to fear of being hospitalized. She stated that the patient frequently accuses family members of saying things that the patient is hearing internally. According to the mother, the patient became upset earlier tonight after experiencing an auditory hallucination in which she believed her father was calling her negative names, and this led to an outburst.  Patient's mother reported that during this outburst, patient went to the kitchen and grabbed several knives. Patient's mother stated she does not feel comfortable with the patient returning home tonight due to safety concerns. She also reported that the patient has an upcoming medication evaluation scheduled for September 16, 2024 with Dr. Jan Donath.  Patient is alert and oriented x4. She was guarded but cooperative, with intermittent irritability. Speech was clear, coherent, and normal rate and volume. Mood is anxious, affect is congruent with mood. Thought process was linear. Patient denies suicidal and homicidal ideation. She denies current auditory or visual hallucinations during evaluation. Insight is limited, and judgment is impaired. She denies substance abuse.   Total Time spent with patient: 30 minutes  Musculoskeletal  Strength & Muscle Tone: within normal limits Gait & Station: normal Patient leans: Right  Psychiatric Specialty Exam  Presentation General Appearance:  Appropriate for Environment  Eye Contact: Good  Speech: Clear and Coherent  Speech Volume: Normal  Handedness: Right   Mood and Affect  Mood: Anxious; Irritable  Affect: Congruent   Thought Process  Thought Processes: Coherent  Descriptions of Associations:Intact  Orientation:Full (Time, Place and Person)  Thought Content:Paranoid Ideation  Diagnosis of Schizophrenia or Schizoaffective disorder in past: Yes  Duration of Psychotic  Symptoms: No data recorded Hallucinations:Hallucinations: None  Ideas of Reference:None  Suicidal  Thoughts:Suicidal Thoughts: No  Homicidal Thoughts:Homicidal Thoughts: No   Sensorium  Memory: Immediate Fair; Recent Fair; Remote Fair  Judgment: Poor  Insight: Poor   Executive Functions  Concentration: Fair  Attention Span: Fair  Recall: Fair  Fund of Knowledge: Fair  Language: Fair   Psychomotor Activity  Psychomotor Activity:No data recorded  Assets  Assets: Desire for Improvement; Communication Skills; Housing; Physical Health; Social Support; Transportation   Sleep  Sleep: Sleep: Good Number of Hours of Sleep: 8   Nutritional Assessment (For OBS and FBC admissions only) Has the patient had a weight loss or gain of 10 pounds or more in the last 3 months?: No Has the patient had a decrease in food intake/or appetite?: No Does the patient have dental problems?: No Does the patient have eating habits or behaviors that may be indicators of an eating disorder including binging or inducing vomiting?: No Has the patient recently lost weight without trying?: 0 Has the patient been eating poorly because of a decreased appetite?: 0 Malnutrition Screening Tool Score: 0    Physical Exam Vitals and nursing note reviewed.  Constitutional:      General: She is not in acute distress.    Appearance: She is well-developed. She is not ill-appearing.  HENT:     Head: Normocephalic and atraumatic.  Eyes:     Conjunctiva/sclera: Conjunctivae normal.  Cardiovascular:     Rate and Rhythm: Normal rate.  Pulmonary:     Effort: Pulmonary effort is normal.  Musculoskeletal:        General: Normal range of motion.     Cervical back: Normal range of motion.  Neurological:     Mental Status: She is alert and oriented to person, place, and time.  Psychiatric:        Attention and Perception: Attention and perception normal.        Mood and Affect: Mood is anxious.         Speech: Speech normal.        Behavior: Behavior is cooperative.        Thought Content: Thought content does not include homicidal or suicidal ideation.        Cognition and Memory: Cognition normal.    Review of Systems  Constitutional: Negative.   HENT: Negative.    Eyes: Negative.   Respiratory: Negative.    Cardiovascular: Negative.   Gastrointestinal: Negative.   Genitourinary: Negative.   Musculoskeletal: Negative.   Skin: Negative.   Neurological: Negative.   Endo/Heme/Allergies: Negative.   Psychiatric/Behavioral:  The patient is nervous/anxious.     Blood pressure 124/81, pulse 85, temperature 98.2 F (36.8 C), temperature source Tympanic, resp. rate 18, SpO2 99%. There is no height or weight on file to calculate BMI.  Past Psychiatric History: Schizoaffective disorder, bipolar type and intellectual disability.    Is the patient at risk to self? Yes  Has the patient been a risk to self in the past 6 months? No .    Has the patient been a risk to self within the distant past? No   Is the patient a risk to others? Yes   Has the patient been a risk to others in the past 6 months? No   Has the patient been a risk to others within the distant past? No   Past Medical History:  Past Medical History:  Diagnosis Date   Asthma    Medical history non-contributory    Psychosis (HCC) 03/19/2017     Family History:  Family History  Problem Relation Age of Onset   Mental illness Neg Hx      Social History: Resides at home with mother, denies substance abuse.    Last Labs:  Admission on 09/10/2024  Component Date Value Ref Range Status   WBC 09/10/2024 10.3  4.0 - 10.5 K/uL Final   RBC 09/10/2024 5.22 (H)  3.87 - 5.11 MIL/uL Final   Hemoglobin 09/10/2024 14.8  12.0 - 15.0 g/dL Final   HCT 87/86/7974 46.1 (H)  36.0 - 46.0 % Final   MCV 09/10/2024 88.3  80.0 - 100.0 fL Final   MCH 09/10/2024 28.4  26.0 - 34.0 pg Final   MCHC 09/10/2024 32.1  30.0 - 36.0  g/dL Final   RDW 87/86/7974 13.6  11.5 - 15.5 % Final   Platelets 09/10/2024 263  150 - 400 K/uL Final   nRBC 09/10/2024 0.0  0.0 - 0.2 % Final   Neutrophils Relative % 09/10/2024 58  % Final   Neutro Abs 09/10/2024 6.1  1.7 - 7.7 K/uL Final   Lymphocytes Relative 09/10/2024 35  % Final   Lymphs Abs 09/10/2024 3.6  0.7 - 4.0 K/uL Final   Monocytes Relative 09/10/2024 5  % Final   Monocytes Absolute 09/10/2024 0.5  0.1 - 1.0 K/uL Final   Eosinophils Relative 09/10/2024 1  % Final   Eosinophils Absolute 09/10/2024 0.1  0.0 - 0.5 K/uL Final   Basophils Relative 09/10/2024 1  % Final   Basophils Absolute 09/10/2024 0.1  0.0 - 0.1 K/uL Final   Immature Granulocytes 09/10/2024 0  % Final   Abs Immature Granulocytes 09/10/2024 0.03  0.00 - 0.07 K/uL Final   Performed at [facility], [address] [place], [place] [address]   Sodium 09/10/2024 140  135 - 145 mmol/L Final   Potassium 09/10/2024 4.5  3.5 - 5.1 mmol/L Final   Chloride 09/10/2024 102  98 - 111 mmol/L Final   CO2 09/10/2024 24  22 - 32 mmol/L Final   Glucose, Bld 09/10/2024 78  70 - 99 mg/dL Final   Glucose reference range applies only to samples taken after fasting for at least 8 hours.   BUN 09/10/2024 8  6 - 20 mg/dL Final   Creatinine, Ser 09/10/2024 0.83  0.44 - 1.00 mg/dL Final   Calcium 87/86/7974 9.6  8.9 - 10.3 mg/dL Final   Total Protein 87/86/7974 8.4 (H)  6.5 - 8.1 g/dL Final   Albumin 87/86/7974 4.0  3.5 - 5.0 g/dL Final   AST 87/86/7974 20  15 - 41 U/L Final   ALT 09/10/2024 18  0 - 44 U/L Final   Alkaline Phosphatase 09/10/2024 78  38 - 126 U/L Final   Total Bilirubin 09/10/2024 0.6  0.0 - 1.2 mg/dL Final   GFR, Estimated 09/10/2024 >60  >60 mL/min Final   Comment: (NOTE) Calculated using the CKD-EPI Creatinine Equation (2021)    Anion gap 09/10/2024 14  5 - 15 Final   Performed at [facility], [address], [place], [place] [address]   Alcohol, Ethyl (B) 09/10/2024 <15  <15 mg/dL Final    Comment: (NOTE) For medical purposes only. Performed at [facility], [address] [place], [place] [address]    POC Amphetamine UR 09/10/2024 None Detected  NONE DETECTED (Cut Off Level 1000 ng/mL) Final   POC Secobarbital (BAR) 09/10/2024 None Detected  NONE DETECTED (Cut Off Level 300 ng/mL) Final   POC Buprenorphine (BUP) 09/10/2024  None Detected  NONE DETECTED (Cut Off Level 10 ng/mL) Final   POC Oxazepam (BZO) 09/10/2024 None Detected  NONE DETECTED (Cut Off Level 300 ng/mL) Final   POC Cocaine UR 09/10/2024 None Detected  NONE DETECTED (Cut Off Level 300 ng/mL) Final   POC Methamphetamine UR 09/10/2024 None Detected  NONE DETECTED (Cut Off Level 1000 ng/mL) Final   POC Morphine 09/10/2024 None Detected  NONE DETECTED (Cut Off Level 300 ng/mL) Final   POC Methadone UR 09/10/2024 None Detected  NONE DETECTED (Cut Off Level 300 ng/mL) Final   POC Oxycodone UR 09/10/2024 None Detected  NONE DETECTED (Cut Off Level 100 ng/mL) Final   POC Marijuana UR 09/10/2024 None Detected  NONE DETECTED (Cut Off Level 50 ng/mL) Final   Preg Test, Ur 09/10/2024 Negative  Negative Final  Admission on 09/01/2024, Discharged on 09/02/2024  Component Date Value Ref Range Status   WBC 09/01/2024 10.2  4.0 - 10.5 K/uL Final   RBC 09/01/2024 4.91  3.87 - 5.11 MIL/uL Final   Hemoglobin 09/01/2024 13.8  12.0 - 15.0 g/dL Final   HCT 87/95/7974 43.0  36.0 - 46.0 % Final   MCV 09/01/2024 87.6  80.0 - 100.0 fL Final   MCH 09/01/2024 28.1  26.0 - 34.0 pg Final   MCHC 09/01/2024 32.1  30.0 - 36.0 g/dL Final   RDW 87/95/7974 13.7  11.5 - 15.5 % Final   Platelets 09/01/2024 260  150 - 400 K/uL Final   nRBC 09/01/2024 0.0  0.0 - 0.2 % Final   Neutrophils Relative % 09/01/2024 59  % Final   Neutro Abs 09/01/2024 6.0  1.7 - 7.7 K/uL Final   Lymphocytes Relative 09/01/2024 34  % Final   Lymphs Abs 09/01/2024 3.5  0.7 - 4.0 K/uL Final   Monocytes Relative 09/01/2024 5  % Final   Monocytes Absolute 09/01/2024  0.5  0.1 - 1.0 K/uL Final   Eosinophils Relative 09/01/2024 1  % Final   Eosinophils Absolute 09/01/2024 0.1  0.0 - 0.5 K/uL Final   Basophils Relative 09/01/2024 1  % Final   Basophils Absolute 09/01/2024 0.1  0.0 - 0.1 K/uL Final   Immature Granulocytes 09/01/2024 0  % Final   Abs Immature Granulocytes 09/01/2024 0.02  0.00 - 0.07 K/uL Final   Performed at [facility], [address], [place], [place] [address]   Sodium 09/01/2024 138  135 - 145 mmol/L Final   Potassium 09/01/2024 4.4  3.5 - 5.1 mmol/L Final   Chloride 09/01/2024 102  98 - 111 mmol/L Final   CO2 09/01/2024 23  22 - 32 mmol/L Final   Glucose, Bld 09/01/2024 80  70 - 99 mg/dL Final   Glucose reference range applies only to samples taken after fasting for at least 8 hours.   BUN 09/01/2024 10  6 - 20 mg/dL Final   Creatinine, Ser 09/01/2024 0.90  0.44 - 1.00 mg/dL Final   Calcium 87/95/7974 9.2  8.9 - 10.3 mg/dL Final   Total Protein 87/95/7974 7.9  6.5 - 8.1 g/dL Final   Albumin 87/95/7974 3.7  3.5 - 5.0 g/dL Final   AST 87/95/7974 19  15 - 41 U/L Final   ALT 09/01/2024 18  0 - 44 U/L Final   Alkaline Phosphatase 09/01/2024 70  38 - 126 U/L Final   Total Bilirubin 09/01/2024 0.6  0.0 - 1.2 mg/dL Final   GFR, Estimated 09/01/2024 >60  >60 mL/min Final   Comment: (NOTE) Calculated using the  CKD-EPI Creatinine Equation (2021)    Anion gap 09/01/2024 13  5 - 15 Final   Performed at [facility], [address] [place], [place] [address]   Hgb A1c MFr Bld 09/01/2024 5.4  4.8 - 5.6 % Final   Comment: (NOTE) Diagnosis of Diabetes The following HbA1c ranges recommended by the American Diabetes Association (ADA) may be used as an aid in the diagnosis of diabetes mellitus.  Hemoglobin             Suggested A1C NGSP%              Diagnosis  <5.7                   Non Diabetic  5.7-6.4                Pre-Diabetic  >6.4                   Diabetic  <7.0                   Glycemic control for                        adults with diabetes.     Mean Plasma Glucose 09/01/2024 108.28  mg/dL Final   Performed at [facility], [address], [place], [place] [address]   Magnesium  09/01/2024 2.1  1.7 - 2.4 mg/dL Final   Performed at [facility], [address] [place], [place] [address]   Alcohol, Ethyl (B) 09/01/2024 <15  <15 mg/dL Final   Comment: (NOTE) For medical purposes only. Performed at [facility], [address] [place], [place] [address]    Cholesterol 09/01/2024 218 (H)  0 - 200 mg/dL Final   Triglycerides 87/95/7974 120  <150 mg/dL Final   HDL 87/95/7974 64  >40 mg/dL Final   Total CHOL/HDL Ratio 09/01/2024 3.4  RATIO Final   VLDL 09/01/2024 24  0 - 40 mg/dL Final   LDL Cholesterol 09/01/2024 130 (H)  0 - 99 mg/dL Final   Comment:        Total Cholesterol/HDL:CHD Risk Coronary Heart Disease Risk Table                     Men   Women  1/2 Average Risk   3.4   3.3  Average Risk       5.0   4.4  2 X Average Risk   9.6   7.1  3 X Average Risk  23.4   11.0        Use the calculated Patient Ratio above and the CHD Risk Table to determine the patient's CHD Risk.        ATP III CLASSIFICATION (LDL):  <100     mg/dL   Optimal  899-870  mg/dL   Near or Above                    Optimal  130-159  mg/dL   Borderline  839-810  mg/dL   High  >809     mg/dL   Very High Performed at [facility], [address], [place], [place] [address]    TSH 09/01/2024 3.431  0.350 - 4.500 uIU/mL Final   Comment: Performed by a 3rd Generation assay with a functional sensitivity of <=0.01 uIU/mL. Performed at [facility], [address] [place], [place] [address]    Color, Urine 09/01/2024 YELLOW  YELLOW Final   APPearance 09/01/2024 HAZY (A)  CLEAR Final   Specific Gravity, Urine 09/01/2024 1.024  1.005 - 1.030 Final   pH 09/01/2024 5.0  5.0 - 8.0 Final   Glucose, UA 09/01/2024 NEGATIVE  NEGATIVE mg/dL Final   Hgb urine dipstick 09/01/2024 NEGATIVE   NEGATIVE Final   Bilirubin Urine 09/01/2024 NEGATIVE  NEGATIVE Final   Ketones, ur 09/01/2024 NEGATIVE  NEGATIVE mg/dL Final   Protein, ur 87/95/7974 NEGATIVE  NEGATIVE mg/dL Final   Nitrite 87/95/7974 NEGATIVE  NEGATIVE Final   Leukocytes,Ua 09/01/2024 LARGE (A)  NEGATIVE Final   RBC / HPF 09/01/2024 0-5  0 - 5 RBC/hpf Final   WBC, UA 09/01/2024 11-20  0 - 5 WBC/hpf Final   Bacteria, UA 09/01/2024 RARE (A)  NONE SEEN Final   Squamous Epithelial / HPF 09/01/2024 0-5  0 - 5 /HPF Final   Mucus 09/01/2024 PRESENT   Final   Performed at [facility], [address], [place], [place] [address]   Preg Test, Ur 09/01/2024 Negative  Negative Final   POC Amphetamine UR 09/01/2024 None Detected  NONE DETECTED (Cut Off Level 1000 ng/mL) Final   POC Secobarbital (BAR) 09/01/2024 None Detected  NONE DETECTED (Cut Off Level 300 ng/mL) Final   POC Buprenorphine (BUP) 09/01/2024 None Detected  NONE DETECTED (Cut Off Level 10 ng/mL) Final   POC Oxazepam (BZO) 09/01/2024 None Detected  NONE DETECTED (Cut Off Level 300 ng/mL) Final   POC Cocaine UR 09/01/2024 None Detected  NONE DETECTED (Cut Off Level 300 ng/mL) Final   POC Methamphetamine UR 09/01/2024 None Detected  NONE DETECTED (Cut Off Level 1000 ng/mL) Final   POC Morphine 09/01/2024 None Detected  NONE DETECTED (Cut Off Level 300 ng/mL) Final   POC Methadone UR 09/01/2024 None Detected  NONE DETECTED (Cut Off Level 300 ng/mL) Final   POC Oxycodone UR 09/01/2024 None Detected  NONE DETECTED (Cut Off Level 100 ng/mL) Final   POC Marijuana UR 09/01/2024 None Detected  NONE DETECTED (Cut Off Level 50 ng/mL) Final    Allergies: Patient has no known allergies.  Medications:  Facility Ordered Medications  Medication   acetaminophen  (TYLENOL ) tablet 650 mg   alum & mag hydroxide-simeth (MAALOX/MYLANTA) 200-200-20 MG/5ML suspension 30 mL   magnesium  hydroxide (MILK OF MAGNESIA) suspension 30 mL   haloperidol  (HALDOL ) tablet 5 mg   And    diphenhydrAMINE  (BENADRYL ) capsule 50 mg   haloperidol  lactate (HALDOL ) injection 5 mg   And   diphenhydrAMINE  (BENADRYL ) injection 50 mg   And   LORazepam  (ATIVAN ) injection 2 mg   haloperidol  lactate (HALDOL ) injection 10 mg   And   diphenhydrAMINE  (BENADRYL ) injection 50 mg   And   LORazepam  (ATIVAN ) injection 2 mg   hydrOXYzine  (ATARAX ) tablet 25 mg   traZODone  (DESYREL ) tablet 50 mg   busPIRone  (BUSPAR ) tablet 10 mg   ARIPiprazole  (ABILIFY ) tablet 20 mg   citalopram  (CELEXA ) tablet 20 mg   PTA Medications  Medication Sig   albuterol  (PROVENTIL  HFA;VENTOLIN  HFA) 108 (90 Base) MCG/ACT inhaler Inhale 2 puffs into the lungs every 4 (four) hours as needed for wheezing or shortness of breath.   busPIRone  (BUSPAR ) 10 MG tablet Take 1 tablet (10 mg total) by mouth 3 (three) times daily.   amantadine  (SYMMETREL ) 100 MG capsule Take 1 capsule (100 mg total) by mouth 2 (two) times daily.   ARIPiprazole  (ABILIFY ) 20 MG  tablet Take 1 tablet (20 mg total) by mouth at bedtime.   Triamcinolone  Acetonide 0.025 % LOTN Apply twice a day as needed (Patient taking differently: Apply 1 Application topically 2 (two) times daily as needed (Apply to affected area).)   sertraline  (ZOLOFT ) 50 MG tablet Take 50 mg by mouth daily.      Medical Decision Making  Given safety concern and report of auditory hallucination, patient will be admited to the observation unit for continuous assessment with follow up by psychiatry. Patient may be appropriate for discharge on 09/11/24 if she is able to contract for safety and no report of auditory hallucination.   Lab Orders         SARS Coronavirus 2 by RT PCR (hospital order, performed in [facility] hospital lab) *cepheid single result test* Anterior Nasal Swab         CBC with Differential/Platelet         Comprehensive metabolic panel         Ethanol         POCT Urine Drug Screen - (I-Screen)         POC urine preg, ED     Resume Home medication:  Abilify   20mg /day --Schizoaffective disorder, bipolar type Buspar  10mg /TID--anxiety Citalopram -Anxiety/depression    Recommendations  Based on my evaluation the patient does not appear to have an emergency medical condition.  Kathryne DELENA Show, NP 09/11/2024  7:06 AM

## 2024-09-11 NOTE — Progress Notes (Addendum)
 Patient is currently resting at this time.  No acute distress noted.  Respirations present, even and unlabored.  No current issues noted.  Will continue Q 15 min safety checks for safety/behavior per facility protocol.

## 2024-09-11 NOTE — Discharge Instructions (Signed)
 Discharge Recommendations:   Medications: Patient is to take medications as prescribed. The patient or patient's guardian is to contact a medical professional and/or outpatient provider to address any new side effects that develop. The patient or the patient's guardian should update outpatient providers of any new medications and/or medication changes.   Outpatient Follow up: Keep follow up appointment with Dr Jan Donath on 09/16/2024  Therapy: We recommend that patient participate in individual therapy to address mental health concerns. Please review list of outpatient counseling resources.   Atypical antipsychotics: If you are prescribed an atypical antipsychotic, (ABILIFY )  it is recommended that your height, weight, BMI, blood pressure, fasting lipid panel, and fasting blood sugar be monitored by your outpatient providers.  Safety:   The following safety precautions should be taken:   No sharp objects. This includes scissors, razors, scrapers, and putty knives.   Chemicals should be removed and locked up.   Medications should be removed and locked up.   Weapons should be removed and locked up. This includes firearms, knives and instruments that can be used to cause injury.   The patient should abstain from use of illicit substances/drugs and abuse of any medications.  If symptoms worsen or do not continue to improve or if the patient becomes actively suicidal or homicidal then it is recommended that the patient return to the closest hospital emergency department, the [facility], or call 911 for further evaluation and treatment.   National Suicide Prevention Lifeline 1-800-SUICIDE or (516)186-7982.  About 988 988 offers 24/7 access to trained crisis counselors who can help people experiencing mental health-related distress. People can call or text 988 or chat 988lifeline.org for themselves or if they are worried about a loved one who may need crisis  support.

## 2024-09-11 NOTE — ED Notes (Signed)
No pain or discomfort noted/ reported. Pt alert, oriented, and ambulatory.  Breathing is even and  unlabored.  Will continue to monitor for safety. 

## 2024-09-27 ENCOUNTER — Ambulatory Visit (HOSPITAL_COMMUNITY)
Admission: EM | Admit: 2024-09-27 | Discharge: 2024-09-27 | Disposition: A | Discharge status: Home / Self Care | Payer: MEDICAID

## 2024-09-27 DIAGNOSIS — Z79899 Other long term (current) drug therapy: Secondary | ICD-10-CM | POA: Diagnosis not present

## 2024-09-27 DIAGNOSIS — R451 Restlessness and agitation: Secondary | ICD-10-CM | POA: Insufficient documentation

## 2024-09-27 DIAGNOSIS — Z638 Other specified problems related to primary support group: Secondary | ICD-10-CM | POA: Insufficient documentation

## 2024-09-27 DIAGNOSIS — F419 Anxiety disorder, unspecified: Secondary | ICD-10-CM | POA: Diagnosis not present

## 2024-09-27 DIAGNOSIS — F259 Schizoaffective disorder, unspecified: Secondary | ICD-10-CM | POA: Diagnosis not present

## 2024-09-27 NOTE — Discharge Instructions (Signed)
 Follow-up with resources provided Follow-up with outpatient provider

## 2024-09-27 NOTE — Progress Notes (Signed)
" °   09/27/24 1915  [facility] Triage Screening (Walk-ins at [facility] only)  How Did You Hear About Us ? Legal System  What Is the Reason for Your Visit/Call Today? Pt presents to [facility] as a voluntary walk-in, accompanied by [employer] due to family discord. Pt reports that she and her mother got into an argument and mom called police. Pt reports diagnosis of anxiety and schizophrenia. Pt denies bring established with outpatient therapist at this time, but was seeing therapist in the past. Pt currently denies SI,HI,AVH and substance/alcohol use.  How Long Has This Been Causing You Problems? <Week  Have You Recently Had Any Thoughts About Hurting Yourself? No  Are You Planning to Commit Suicide/Harm Yourself At This time? No  Have you Recently Had Thoughts About Hurting Someone [name]? No  Are You Planning To Harm Someone At This Time? No  Explanation: pt denies  Physical Abuse Denies  Verbal Abuse Denies  Sexual Abuse Denies  Exploitation of patient/patient's resources Denies  Self-Neglect Denies  Are you currently experiencing any auditory, visual or other hallucinations? No  Have You Used Any Alcohol or Drugs in the Past 24 Hours? No  Do you have any current medical co-morbidities that require immediate attention? No  Clinician description of patient physical appearance/behavior: cooperative, calm, casually dressed  What Do You Feel Would Help You the Most Today? Stress Management;Social Support  If access to [facility] was not available, would you have sought care in the Emergency Department? No  Determination of Need Routine (7 days)  Options For Referral Other: Comment;Outpatient Therapy;Medication Management    "

## 2024-09-27 NOTE — BH Assessment (Incomplete)
 Tracy Bond is a 23 year old female presenting as a voluntary walk-in brought in by the police. Patient has history of anxiety and depression. Patient denied SI, HI, psychosis and alcohol/drug usage.   Patient reports getting into an argument with her mother. Patient states I was cursing and lashing out, I had a bad day that's all. Patient states I thought she called me the r-word but she didn't, she even told the police once they got there that she did not say that, I must have been hearing things. Patient denied being depressed. Patient reports normal appetite and sleep.   Patient is seen by Dr. Atkintayo for medication management. Patient reports taking medication as prescribed and that her medications are working. Patient does not have a therapist. Per chart last psych hospitalization was in 2021 at [facility]. Patient denied prior suicide attempts and self-harming behaviors.   Patient reports living with her mother. Patient is currently on disability due to mental health. Patient denied access to guns. Patient was calm and cooperative during assessment.
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
## 2024-09-27 NOTE — ED Provider Notes (Signed)
 Behavioral Health Urgent Care Medical Screening Exam  Patient Name: Tracy Bond MRN: [id number] Date of Evaluation: 09/28/2024 Chief Complaint:  family discord  Diagnosis:  Final diagnoses:  Family discord  Agitation    History of Present illness: Tracy Bond is a 23 y.o. female.  With a history of schizoaffective disorder, aggressive behavior.  Presented to [facility] via [employer].  Per the patient she and her mom got into an argument and the mom called the police on her.  Review of patient records show similar situations for the last 2 urgent care visit.  Patient was seen on 09/10/2024 and 09/01/2024 for similar situations.  Patient stated that she did not threaten them or anything she was just mad and just cursed them out and that is why they called the police on her.  Patient does see a med provider and according to her she is current with her medication.  Patient denies seeing a therapist at this time.  According to patient she lives with her mom and her sister.    Collaborate: TTS did speak with patient's mother [name], patient mother stated that patient does this all the time but this is the first time she had to call the police because the patient was just getting agitated.  Patient mother stated that patient does have an appointment with her psych provider in February however she is going to try and get an appointment to see him more earlier.  Patient mother stated that she is thinking about getting the patient into a group home because she is tired of going through this where the patient becomes agitated every time for no reason.  According to patient mom patient wakes up agitated and she is agitated throughout the whole day until she goes back to sleep.  Patient mom stated that she could come to pick up the patient when the patient was ready.  When asked if she can pick up the patient tonight patient mom stated yes she will be there in a short minute to pick the patient up.    Face-to-face evaluation of patient, patient is alert and oriented x 4, speech is clear, maintain eye contact.  Patient does not appear to be in any immediate distress.  Does not seem to be influenced by internal stimuli.  Patient denies SI, HI, AVH or paranoia.  Denies alcohol use, denies illicit drug use.  Denies want to hurt herself or others.  Patient stated that I am ready to go home now.  Patient is advised to call 911 or return to nearest ED should she experience suicidal thoughts homicidal ideation or hallucination.  At this present moment patient does not seem to be a risk to herself and can safely be discharged home with her mom.  Recommend discharge for patient/mom to follow-up with outpatient provider   Flowsheet Row ED from 09/27/2024 in [facility] ED from 09/10/2024 in [facility] ED from 09/01/2024 in [facility]  C-SSRS RISK CATEGORY No Risk No Risk No Risk    Psychiatric Specialty Exam  Presentation  General Appearance:Casual  Eye Contact:Fair  Speech:Clear and Coherent  Speech Volume:Normal  Handedness:Right   Mood and Affect  Mood: Anxious  Affect: Congruent   Thought Process  Thought Processes: Coherent  Descriptions of Associations:Intact  Orientation:Full (Time, Place and Person)  Thought Content:WDL  Diagnosis of Schizophrenia or Schizoaffective disorder in past: No  Duration of Psychotic Symptoms: No data recorded Hallucinations:None  Ideas of Reference:None  Suicidal Thoughts:No  Homicidal Thoughts:No   Sensorium  Memory: Immediate Fair  Judgment: Poor  Insight: Poor   Executive Functions  Concentration: Fair  Attention Span: Fair  Recall: [employer] of Knowledge: Fair  Language: Fair   Psychomotor Activity  Psychomotor Activity: Normal   Assets  Assets: Desire for Improvement; Resilience;  Vocational/Educational   Sleep  Sleep: Fair  Number of hours:  8   Physical Exam: Physical Exam HENT:     Head: Normocephalic.     Nose: Nose normal.  Eyes:     Pupils: Pupils are equal, round, and reactive to light.  Cardiovascular:     Rate and Rhythm: Normal rate.  Pulmonary:     Effort: Pulmonary effort is normal.  Musculoskeletal:        General: Normal range of motion.     Cervical back: Normal range of motion.  Neurological:     General: No focal deficit present.     Mental Status: She is alert.  Psychiatric:        Mood and Affect: Mood normal.        Behavior: Behavior normal.        Thought Content: Thought content normal.    Review of Systems  Constitutional: Negative.   HENT: Negative.    Eyes: Negative.   Respiratory: Negative.    Cardiovascular: Negative.   Gastrointestinal: Negative.   Genitourinary: Negative.   Musculoskeletal: Negative.   Skin: Negative.   Neurological: Negative.   Psychiatric/Behavioral:  The patient is nervous/anxious.    Blood pressure 131/85, pulse 94, temperature 98.3 F (36.8 C), temperature source Oral, resp. rate 18, SpO2 99%. There is no height or weight on file to calculate BMI.  Musculoskeletal: Strength & Muscle Tone: within normal limits Gait & Station: normal Patient leans: N/A   [facility] MSE Discharge Disposition for Follow up and Recommendations: Based on my evaluation the patient does not appear to have an emergency medical condition and can be discharged with resources and follow up care in outpatient services for Individual Therapy   Gaither Pouch, NP 09/28/2024, 1:49 AM
